# Patient Record
Sex: Male | Born: 1955 | Race: Black or African American | Hispanic: No | Marital: Single | Smoking: Current every day smoker
Health system: Southern US, Community
[De-identification: ages and names within clinical notes are randomized; demographics above are authoritative.]

## PROBLEM LIST (undated history)

## (undated) DIAGNOSIS — F329 Major depressive disorder, single episode, unspecified: Secondary | ICD-10-CM

## (undated) DIAGNOSIS — IMO0001 Reserved for inherently not codable concepts without codable children: Secondary | ICD-10-CM

## (undated) DIAGNOSIS — F32A Depression, unspecified: Secondary | ICD-10-CM

## (undated) DIAGNOSIS — M1990 Unspecified osteoarthritis, unspecified site: Secondary | ICD-10-CM

## (undated) DIAGNOSIS — K5790 Diverticulosis of intestine, part unspecified, without perforation or abscess without bleeding: Secondary | ICD-10-CM

## (undated) DIAGNOSIS — E785 Hyperlipidemia, unspecified: Secondary | ICD-10-CM

## (undated) DIAGNOSIS — C801 Malignant (primary) neoplasm, unspecified: Secondary | ICD-10-CM

## (undated) DIAGNOSIS — K219 Gastro-esophageal reflux disease without esophagitis: Secondary | ICD-10-CM

## (undated) DIAGNOSIS — I10 Essential (primary) hypertension: Secondary | ICD-10-CM

## (undated) DIAGNOSIS — C61 Malignant neoplasm of prostate: Secondary | ICD-10-CM

## (undated) DIAGNOSIS — F419 Anxiety disorder, unspecified: Secondary | ICD-10-CM

## (undated) HISTORY — PX: HIP ARTHROPLASTY: SHX981

## (undated) HISTORY — DX: Diverticulosis of intestine, part unspecified, without perforation or abscess without bleeding: K57.90

## (undated) HISTORY — DX: Hyperlipidemia, unspecified: E78.5

## (undated) HISTORY — DX: Anxiety disorder, unspecified: F41.9

## (undated) HISTORY — PX: COLONOSCOPY: SHX174

## (undated) HISTORY — DX: Malignant neoplasm of prostate: C61

## (undated) HISTORY — PX: TRANSURETHRAL RESECTION OF PROSTATE: SHX73

## (undated) HISTORY — DX: Malignant (primary) neoplasm, unspecified: C80.1

## (undated) HISTORY — DX: Unspecified osteoarthritis, unspecified site: M19.90

## (undated) HISTORY — DX: Gastro-esophageal reflux disease without esophagitis: K21.9

---

## 1999-02-21 ENCOUNTER — Emergency Department (HOSPITAL_COMMUNITY): Admission: EM | Admit: 1999-02-21 | Discharge: 1999-02-21 | Payer: Self-pay | Admitting: Emergency Medicine

## 2005-04-16 DIAGNOSIS — C61 Malignant neoplasm of prostate: Secondary | ICD-10-CM

## 2005-04-16 DIAGNOSIS — C801 Malignant (primary) neoplasm, unspecified: Secondary | ICD-10-CM

## 2005-04-16 HISTORY — DX: Malignant neoplasm of prostate: C61

## 2005-04-16 HISTORY — PX: PROSTATECTOMY: SHX69

## 2005-04-16 HISTORY — DX: Malignant (primary) neoplasm, unspecified: C80.1

## 2011-05-12 DIAGNOSIS — F322 Major depressive disorder, single episode, severe without psychotic features: Secondary | ICD-10-CM | POA: Diagnosis not present

## 2011-05-23 ENCOUNTER — Emergency Department (INDEPENDENT_AMBULATORY_CARE_PROVIDER_SITE_OTHER)
Admission: EM | Admit: 2011-05-23 | Discharge: 2011-05-23 | Disposition: A | Discharge status: Home / Self Care | Payer: Medicare Other | Source: Home / Self Care | Attending: Family Medicine | Admitting: Family Medicine

## 2011-05-23 ENCOUNTER — Encounter (HOSPITAL_COMMUNITY): Payer: Self-pay | Admitting: *Deleted

## 2011-05-23 DIAGNOSIS — C61 Malignant neoplasm of prostate: Secondary | ICD-10-CM | POA: Diagnosis not present

## 2011-05-23 NOTE — ED Provider Notes (Addendum)
History     CSN: [id number]  Arrival date & time 05/23/11  1411   First MD Initiated Contact with Patient 05/23/11 1518      Chief Complaint  Patient presents with  . Labs Only    (Consider location/radiation/quality/duration/timing/severity/associated sxs/prior treatment) HPI Comments: Ruben Andrews presents today for evaluation for a PSA check. He reports a history of prostate cancer 5 years ago. He reports a prostatectomy. He states they did not "remove it all." He reports that he then had radiation treatment. He had regular followups in [place] at this time. However, he recently became estranged from his fiance/girlfriend. Then moved back to [place] suddenly to this mom. Reports he has not had his PSA checked in a while and was supposed to. He last had it checked in August and reportedly it was a level of 9. He is rather emotionally distraught today. And emotionally labile. He is fluctuating between anger and being tearful. He is apologizing profusely for these emotions. He does not have a primary care provider here in the area. Nor has he had regular followup care since August. He reports that he is was a former [employer], but is currently unemployed. He states that once he knows the level he will be able to go back to his provider in [place] for regular followup or you will find a provider here. We discussed the meaningful use of a PSA simply. We also discussed that he will need much more followup examinations. We will check his PSA today. He denies any symptoms today.  Patient is a 56 y.o. male presenting with male genitourinary complaint. The history is provided by the patient.  Male GU Problem Primary symptoms include no dysuria, no penile discharge and no penile pain. This is a chronic problem. The problem has not changed since onset.Pertinent negatives include no anorexia, no nausea and no vomiting. There has been no fever.    Past Medical History  Diagnosis Date  . Prostate disease      Past Surgical History  Procedure Date  . Hip surgery     History reviewed. No pertinent family history.  History  Substance Use Topics  . Smoking status: Current Everyday Smoker  . Smokeless tobacco: Not on file  . Alcohol Use: Yes     VERY LITTLE      Review of Systems  Constitutional: Negative.   HENT: Negative.   Eyes: Negative.   Respiratory: Negative.   Cardiovascular: Negative.   Gastrointestinal: Negative.  Negative for nausea, vomiting and anorexia.  Genitourinary: Negative.  Negative for dysuria, penile pain and penile discharge.  Musculoskeletal: Negative.   Skin: Negative.   Neurological: Negative.     Allergies  Review of patient's allergies indicates not on file.  Home Medications  No current outpatient prescriptions on file.  BP 156/92  Pulse 77  Temp(Src) 98.2 F (36.8 C) (Oral)  Resp 18  SpO2 96%  Physical Exam  Nursing note and vitals reviewed. Constitutional: He is oriented to person, place, and time. He appears well-developed and well-nourished.  HENT:  Head: Normocephalic and atraumatic.  Eyes: EOM are normal.  Neck: Normal range of motion.  Pulmonary/Chest: Effort normal.  Musculoskeletal: Normal range of motion.  Neurological: He is alert and oriented to person, place, and time.  Skin: Skin is warm and dry.  Psychiatric: His behavior is normal.    ED Course  Procedures (including critical care time)   Labs Reviewed  PSA   No results found.   1. Prostate  cancer       MDM  Checked PSA; will inform him of result once back and reviewed; he wishes results to be telephoned to his mother's residence, Ms. Ruben Andrews: [phone]        Richardo Priest, MD 05/23/11 1724  Richardo Priest, MD 05/23/11 1726

## 2011-05-23 NOTE — ED Notes (Signed)
Pt  Wants  His   PSA   CHECKED    PT ALLEDGES  TO  HAVE  HISTORY  OF  PROSTATE  PROBLEMS   IN PAST        HE  IS  FROM  OUT OF  STATE     AND  WNTS  BLOOD  WORK  DONE  HE   VERBALIZES  NO  SYMPTOMS         HE  WALKS  WUPRIGHT  WITH A  SMOOTH  STEADY  GAIT

## 2011-06-06 ENCOUNTER — Telehealth (HOSPITAL_COMMUNITY): Payer: Self-pay | Admitting: *Deleted

## 2011-06-06 NOTE — ED Notes (Signed)
2/18 Pt. called on VM and requested result of PSA.  2/20 I called pt. Back and verified DOB and told pt. PSA was 1.78 and was WNL.  Pt. asked about getting a refill of his pain medicines,if he gets the information from his doctor. He is visiting from [place] for 2 more weeks. I told him we did not do chronic pain management. He should call his PCP or go to an ED.

## 2011-06-06 NOTE — ED Notes (Signed)
Pt. called back and said if he got his doctors office in [place] to fax the record of his medications, could he get refill of Lortab.  Hx. bil. hip replacements. I told him he could try but I could not guarantee that our doctor would do a refill. He needs his doctors office number also, so our doctor can call and verify information.Ruben Andrews 06/06/2011

## 2011-07-05 ENCOUNTER — Emergency Department (INDEPENDENT_AMBULATORY_CARE_PROVIDER_SITE_OTHER)
Admission: EM | Admit: 2011-07-05 | Discharge: 2011-07-05 | Disposition: A | Discharge status: Home / Self Care | Payer: Medicare Other | Source: Home / Self Care | Attending: Family Medicine | Admitting: Family Medicine

## 2011-07-05 ENCOUNTER — Encounter (HOSPITAL_COMMUNITY): Payer: Self-pay

## 2011-07-05 DIAGNOSIS — J069 Acute upper respiratory infection, unspecified: Secondary | ICD-10-CM

## 2011-07-05 DIAGNOSIS — G8929 Other chronic pain: Secondary | ICD-10-CM

## 2011-07-05 HISTORY — DX: Essential (primary) hypertension: I10

## 2011-07-05 HISTORY — DX: Depression, unspecified: F32.A

## 2011-07-05 HISTORY — DX: Major depressive disorder, single episode, unspecified: F32.9

## 2011-07-05 MED ORDER — AMOXICILLIN 500 MG PO CAPS: 500.0000 mg | ORAL_CAPSULE | Freq: Three times a day (TID) | ORAL | Status: AC

## 2011-07-05 MED ORDER — GUAIFENESIN-CODEINE 100-10 MG/5ML PO SYRP: 5.0000 mL | ORAL_SOLUTION | Freq: Four times a day (QID) | ORAL | Status: AC | PRN

## 2011-07-05 NOTE — ED Notes (Signed)
C/o head congestion, productive cough, chest hurts with cough and SOB.  SX for 2 days.  Denies fever.

## 2011-07-05 NOTE — Discharge Instructions (Signed)
Tylenol or motrin as needed. Avoid caffeine and milk products. Call number for pain management. Have number for your doctor in [place] ready as well.

## 2011-07-05 NOTE — ED Provider Notes (Signed)
History     CSN: [id number]  Arrival date & time 07/05/11  0805   First MD Initiated Contact with Patient 07/05/11 [phone]      Chief Complaint  Patient presents with  . URI  . Cough    (Consider location/radiation/quality/duration/timing/severity/associated sxs/prior treatment) HPI Comments: The patient reports having cough and cold symptoms x 2 days. Chest hurts with coughing. Cough is non productive. Denies fever. In addition he is asking for pain medication. States he has been in pain management in [place] but moved here over 2 months ago. States he has not had medications in over 2 months. He is informed we would not be able to help with pain management but could give him the name of the pain management clinic.   The history is provided by the patient.    Past Medical History  Diagnosis Date  . Prostate disease   . Hypertension   . Depression     Past Surgical History  Procedure Date  . Hip surgery     History reviewed. No pertinent family history.  History  Substance Use Topics  . Smoking status: Current Everyday Smoker -- 1.0 packs/day  . Smokeless tobacco: Not on file  . Alcohol Use: Yes     VERY LITTLE      Review of Systems  Constitutional: Negative.   HENT: Positive for congestion, rhinorrhea and postnasal drip. Negative for ear pain, sore throat and facial swelling.   Respiratory: Positive for cough.   Cardiovascular: Negative.   Gastrointestinal: Negative.   Genitourinary: Negative.   Skin: Negative.     Allergies  Review of patient's allergies indicates no known allergies.  Home Medications   Current Outpatient Rx  Name Route Sig Dispense Refill  . AMOXICILLIN 500 MG PO CAPS Oral Take 1 capsule (500 mg total) by mouth 3 (three) times daily. 30 capsule 0  . GUAIFENESIN-CODEINE 100-10 MG/5ML PO SYRP Oral Take 5 mLs by mouth every 6 (six) hours as needed for cough. 120 mL 0    BP 134/93  Pulse 72  Temp(Src) 97.7 F (36.5 C) (Oral)  Resp  16  SpO2 97%  Physical Exam  Nursing note and vitals reviewed. Constitutional: He appears well-developed and well-nourished.  HENT:  Head: Atraumatic.       Ears clear, nose clear, throat mild erythema  Neck: Normal range of motion. Neck supple.  Cardiovascular: Normal rate, regular rhythm and normal heart sounds.   Pulmonary/Chest: Effort normal and breath sounds normal.       Congested cough  Lymphadenopathy:    He has no cervical adenopathy.  Skin: Skin is warm and dry.    ED Course  Procedures (including critical care time)  Labs Reviewed - No data to display No results found.   1. Upper respiratory infection   2. Chronic pain       MDM          Randa Spike, MD 07/05/11 [phone]

## 2011-07-23 DIAGNOSIS — M545 Low back pain: Secondary | ICD-10-CM | POA: Diagnosis not present

## 2011-07-23 DIAGNOSIS — I10 Essential (primary) hypertension: Secondary | ICD-10-CM | POA: Diagnosis not present

## 2011-07-23 DIAGNOSIS — G8929 Other chronic pain: Secondary | ICD-10-CM | POA: Diagnosis not present

## 2011-07-23 DIAGNOSIS — Z9119 Patient's noncompliance with other medical treatment and regimen: Secondary | ICD-10-CM | POA: Diagnosis not present

## 2011-07-23 DIAGNOSIS — F329 Major depressive disorder, single episode, unspecified: Secondary | ICD-10-CM | POA: Diagnosis not present

## 2011-10-05 DIAGNOSIS — F17200 Nicotine dependence, unspecified, uncomplicated: Secondary | ICD-10-CM | POA: Diagnosis not present

## 2011-10-05 DIAGNOSIS — I10 Essential (primary) hypertension: Secondary | ICD-10-CM | POA: Diagnosis not present

## 2011-10-05 DIAGNOSIS — M76899 Other specified enthesopathies of unspecified lower limb, excluding foot: Secondary | ICD-10-CM | POA: Diagnosis not present

## 2011-10-05 DIAGNOSIS — Z96649 Presence of unspecified artificial hip joint: Secondary | ICD-10-CM | POA: Diagnosis not present

## 2011-10-05 DIAGNOSIS — M25559 Pain in unspecified hip: Secondary | ICD-10-CM | POA: Diagnosis not present

## 2011-10-05 DIAGNOSIS — R262 Difficulty in walking, not elsewhere classified: Secondary | ICD-10-CM | POA: Diagnosis not present

## 2011-11-30 DIAGNOSIS — C61 Malignant neoplasm of prostate: Secondary | ICD-10-CM | POA: Diagnosis not present

## 2011-12-06 DIAGNOSIS — F39 Unspecified mood [affective] disorder: Secondary | ICD-10-CM | POA: Diagnosis not present

## 2012-01-08 DIAGNOSIS — Z0000 Encounter for general adult medical examination without abnormal findings: Secondary | ICD-10-CM | POA: Diagnosis not present

## 2012-01-10 DIAGNOSIS — F17200 Nicotine dependence, unspecified, uncomplicated: Secondary | ICD-10-CM | POA: Diagnosis not present

## 2012-01-10 DIAGNOSIS — F329 Major depressive disorder, single episode, unspecified: Secondary | ICD-10-CM | POA: Diagnosis not present

## 2012-01-10 DIAGNOSIS — Z8546 Personal history of malignant neoplasm of prostate: Secondary | ICD-10-CM | POA: Diagnosis not present

## 2012-01-10 DIAGNOSIS — M25559 Pain in unspecified hip: Secondary | ICD-10-CM | POA: Diagnosis not present

## 2012-01-10 DIAGNOSIS — I10 Essential (primary) hypertension: Secondary | ICD-10-CM | POA: Diagnosis not present

## 2012-01-27 DIAGNOSIS — Z8546 Personal history of malignant neoplasm of prostate: Secondary | ICD-10-CM | POA: Diagnosis not present

## 2012-01-27 DIAGNOSIS — M25559 Pain in unspecified hip: Secondary | ICD-10-CM | POA: Diagnosis not present

## 2012-01-27 DIAGNOSIS — Z96649 Presence of unspecified artificial hip joint: Secondary | ICD-10-CM | POA: Diagnosis not present

## 2012-01-29 DIAGNOSIS — I10 Essential (primary) hypertension: Secondary | ICD-10-CM | POA: Diagnosis not present

## 2012-01-29 DIAGNOSIS — M25559 Pain in unspecified hip: Secondary | ICD-10-CM | POA: Diagnosis not present

## 2012-01-29 DIAGNOSIS — Z1339 Encounter for screening examination for other mental health and behavioral disorders: Secondary | ICD-10-CM | POA: Diagnosis not present

## 2012-01-29 DIAGNOSIS — F17200 Nicotine dependence, unspecified, uncomplicated: Secondary | ICD-10-CM | POA: Diagnosis not present

## 2012-01-29 DIAGNOSIS — Z1331 Encounter for screening for depression: Secondary | ICD-10-CM | POA: Diagnosis not present

## 2012-01-29 DIAGNOSIS — R269 Unspecified abnormalities of gait and mobility: Secondary | ICD-10-CM | POA: Diagnosis not present

## 2012-02-05 DIAGNOSIS — I10 Essential (primary) hypertension: Secondary | ICD-10-CM | POA: Diagnosis not present

## 2012-04-11 DIAGNOSIS — F39 Unspecified mood [affective] disorder: Secondary | ICD-10-CM | POA: Diagnosis not present

## 2012-06-20 DIAGNOSIS — T8489XA Other specified complication of internal orthopedic prosthetic devices, implants and grafts, initial encounter: Secondary | ICD-10-CM | POA: Diagnosis not present

## 2012-06-20 DIAGNOSIS — Z96649 Presence of unspecified artificial hip joint: Secondary | ICD-10-CM | POA: Diagnosis not present

## 2012-06-20 DIAGNOSIS — M25559 Pain in unspecified hip: Secondary | ICD-10-CM | POA: Diagnosis not present

## 2012-07-04 DIAGNOSIS — T8489XA Other specified complication of internal orthopedic prosthetic devices, implants and grafts, initial encounter: Secondary | ICD-10-CM | POA: Diagnosis not present

## 2012-07-04 DIAGNOSIS — M25559 Pain in unspecified hip: Secondary | ICD-10-CM | POA: Diagnosis not present

## 2012-07-22 DIAGNOSIS — M25559 Pain in unspecified hip: Secondary | ICD-10-CM | POA: Diagnosis not present

## 2012-07-22 DIAGNOSIS — I10 Essential (primary) hypertension: Secondary | ICD-10-CM | POA: Diagnosis not present

## 2012-07-22 DIAGNOSIS — C61 Malignant neoplasm of prostate: Secondary | ICD-10-CM | POA: Diagnosis not present

## 2013-03-16 DIAGNOSIS — Z1331 Encounter for screening for depression: Secondary | ICD-10-CM | POA: Diagnosis not present

## 2013-03-16 DIAGNOSIS — M25559 Pain in unspecified hip: Secondary | ICD-10-CM | POA: Diagnosis not present

## 2013-03-16 DIAGNOSIS — Z6837 Body mass index (BMI) 37.0-37.9, adult: Secondary | ICD-10-CM | POA: Diagnosis not present

## 2013-03-16 DIAGNOSIS — Z800 Family history of malignant neoplasm of digestive organs: Secondary | ICD-10-CM | POA: Diagnosis not present

## 2013-03-16 DIAGNOSIS — I10 Essential (primary) hypertension: Secondary | ICD-10-CM | POA: Diagnosis not present

## 2013-03-16 DIAGNOSIS — R972 Elevated prostate specific antigen [PSA]: Secondary | ICD-10-CM | POA: Diagnosis not present

## 2013-03-16 DIAGNOSIS — Z8546 Personal history of malignant neoplasm of prostate: Secondary | ICD-10-CM | POA: Diagnosis not present

## 2013-03-18 ENCOUNTER — Ambulatory Visit (AMBULATORY_SURGERY_CENTER): Payer: Medicare Other | Admitting: *Deleted

## 2013-03-18 ENCOUNTER — Other Ambulatory Visit (HOSPITAL_COMMUNITY): Payer: Self-pay | Admitting: Internal Medicine

## 2013-03-18 VITALS — Ht 73.0 in | Wt 280.2 lb

## 2013-03-18 DIAGNOSIS — R972 Elevated prostate specific antigen [PSA]: Secondary | ICD-10-CM

## 2013-03-18 DIAGNOSIS — Z86010 Personal history of colonic polyps: Secondary | ICD-10-CM

## 2013-03-18 MED ORDER — NA SULFATE-K SULFATE-MG SULF 17.5-3.13-1.6 GM/177ML PO SOLN: ORAL | Status: DC

## 2013-03-18 NOTE — Progress Notes (Signed)
Patient denies any allergies to eggs or soy. Patient denies any problems with anesthesia.  

## 2013-03-19 DIAGNOSIS — N433 Hydrocele, unspecified: Secondary | ICD-10-CM | POA: Diagnosis not present

## 2013-03-19 DIAGNOSIS — N393 Stress incontinence (female) (male): Secondary | ICD-10-CM | POA: Diagnosis not present

## 2013-03-19 DIAGNOSIS — C61 Malignant neoplasm of prostate: Secondary | ICD-10-CM | POA: Diagnosis not present

## 2013-03-19 DIAGNOSIS — N529 Male erectile dysfunction, unspecified: Secondary | ICD-10-CM | POA: Diagnosis not present

## 2013-03-27 ENCOUNTER — Encounter (HOSPITAL_COMMUNITY)
Admission: RE | Admit: 2013-03-27 | Discharge: 2013-03-27 | Disposition: A | Discharge status: Home / Self Care | Payer: Medicare Other | Source: Ambulatory Visit | Attending: Internal Medicine | Admitting: Internal Medicine

## 2013-03-27 DIAGNOSIS — R972 Elevated prostate specific antigen [PSA]: Secondary | ICD-10-CM | POA: Insufficient documentation

## 2013-03-27 DIAGNOSIS — Z96649 Presence of unspecified artificial hip joint: Secondary | ICD-10-CM | POA: Diagnosis not present

## 2013-03-27 DIAGNOSIS — Z8546 Personal history of malignant neoplasm of prostate: Secondary | ICD-10-CM | POA: Diagnosis not present

## 2013-03-27 DIAGNOSIS — M25559 Pain in unspecified hip: Secondary | ICD-10-CM | POA: Insufficient documentation

## 2013-03-27 MED ORDER — TECHNETIUM TC 99M MEDRONATE IV KIT
25.4000 | PACK | Freq: Once | INTRAVENOUS | Status: AC | PRN
  Administered 2013-03-27: 25.4 via INTRAVENOUS

## 2013-03-30 ENCOUNTER — Encounter: Payer: Self-pay | Admitting: Internal Medicine

## 2013-03-30 ENCOUNTER — Ambulatory Visit (AMBULATORY_SURGERY_CENTER): Payer: Medicare Other | Admitting: Internal Medicine

## 2013-03-30 VITALS — BP 121/76 | HR 65 | Temp 97.4°F | Resp 25 | Ht 73.0 in | Wt 280.0 lb

## 2013-03-30 DIAGNOSIS — Z860100 Personal history of colon polyps, unspecified: Secondary | ICD-10-CM | POA: Insufficient documentation

## 2013-03-30 DIAGNOSIS — D126 Benign neoplasm of colon, unspecified: Secondary | ICD-10-CM

## 2013-03-30 DIAGNOSIS — Z800 Family history of malignant neoplasm of digestive organs: Secondary | ICD-10-CM | POA: Diagnosis not present

## 2013-03-30 DIAGNOSIS — K5730 Diverticulosis of large intestine without perforation or abscess without bleeding: Secondary | ICD-10-CM

## 2013-03-30 DIAGNOSIS — Z1211 Encounter for screening for malignant neoplasm of colon: Secondary | ICD-10-CM | POA: Diagnosis not present

## 2013-03-30 DIAGNOSIS — I10 Essential (primary) hypertension: Secondary | ICD-10-CM | POA: Diagnosis not present

## 2013-03-30 MED ORDER — SODIUM CHLORIDE 0.9 % IV SOLN: 500.0000 mL | INTRAVENOUS | Status: DC

## 2013-03-30 NOTE — Progress Notes (Signed)
Patient did not have preoperative order for IV antibiotic SSI prophylaxis. (G8918)  Patient did not experience any of the following events: a burn prior to discharge; a fall within the facility; wrong site/side/patient/procedure/implant event; or a hospital transfer or hospital admission upon discharge from the facility. (G8907)  

## 2013-03-30 NOTE — Progress Notes (Signed)
Called to room to assist during endoscopic procedure.  Patient ID and intended procedure confirmed with present staff. Received instructions for my participation in the procedure from the performing physician.  

## 2013-03-30 NOTE — Op Note (Signed)
[facility] [address] [place], [address]   COLONOSCOPY PROCEDURE REPORT  PATIENT: Ruben Andrews, Ruben Andrews  MR#: [id number] BIRTHDATE: 1955-07-07 , 57  yrs. old GENDER: Male ENDOSCOPIST: Iva Boop, MD, [facility] REFERRED AO:ZHYQM Link Snuffer, M.D. PROCEDURE DATE:  03/30/2013 PROCEDURE:   Colonoscopy with biopsy and snare polypectomy First Screening Colonoscopy - Avg.  risk and is 50 yrs.  old or older - No.  Prior Negative Screening - Now for repeat screening. N/A  History of Adenoma - Now for follow-up colonoscopy & has been > or = to 3 yrs.  Yes hx of adenoma.  Has been 3 or more years since last colonoscopy.  Polyps Removed Today? Yes. ASA CLASS:   Class II INDICATIONS:Patient's personal history of adenomatous colon polyps and Patient's immediate family history of colon cancer. MEDICATIONS: propofol (Diprivan) 300mg  IV, MAC sedation, administered by CRNA, and These medications were titrated to patient response per physician's verbal order  DESCRIPTION OF PROCEDURE:   After the risks benefits and alternatives of the procedure were thoroughly explained, informed consent was obtained.  A digital rectal exam revealed no abnormalities of the rectum, A digital rectal exam revealed no prostatic nodules, and A digital rectal exam revealed the prostate was not enlarged.   The LB [id number]  endoscope was introduced through the anus and advanced to the cecum, which was identified by both the appendix and ileocecal valve. No adverse events experienced.   The quality of the prep was excellent using Suprep  The instrument was then slowly withdrawn as the colon was fully examined.  COLON FINDINGS: Four diminutive sessile polyps were found at the cecum and in the sigmoid colon.  A polypectomy was performed with cold forceps (cecum)and with a cold snare (sigmoid).  The resection was complete and the polyp tissue was completely retrieved.   Mild diverticulosis was noted  throughout the entire examined colon. The colon mucosa was otherwise normal.  Retroflexed views revealed no abnormalities. The time to cecum=4 minutes 16 seconds. Withdrawal time=8 minutes 37 seconds.  The scope was withdrawn and the procedure completed. COMPLICATIONS: There were no complications.  ENDOSCOPIC IMPRESSION: 1.   Four diminutive sessile polyps were found at the cecum and in the sigmoid colon; polypectomy was performed with cold forceps and with a cold snare 2.   Mild diverticulosis was noted throughout the entire examined colon 3.   The colon mucosa was otherwise normal  RECOMMENDATIONS: 1.  Timing of repeat colonoscopy will be determined by pathology findings. 2.   Probably in 5 years (2019)  eSigned:  Iva Boop, MD, [facility] 03/30/2013 1:56 PM cc: The Patient  and Alysia Penna, MD

## 2013-03-30 NOTE — Patient Instructions (Addendum)
I found and removed 4 small polyps. You also have a condition called diverticulosis - common and not usually a problem. Please read the handout provided.  I will let you know pathology results and when to have another routine colonoscopy by mail.  I appreciate the opportunity to care for you. Iva Boop, MD, FACG   YOU HAD AN ENDOSCOPIC PROCEDURE TODAY AT THE [facility]: Refer to the procedure report that was given to you for any specific questions about what was found during the examination.  If the procedure report does not answer your questions, please call your gastroenterologist to clarify.  If you requested that your care partner not be given the details of your procedure findings, then the procedure report has been included in a sealed envelope for you to review at your convenience later.  YOU SHOULD EXPECT: Some feelings of bloating in the abdomen. Passage of more gas than usual.  Walking can help get rid of the air that was put into your GI tract during the procedure and reduce the bloating. If you had a lower endoscopy (such as a colonoscopy or flexible sigmoidoscopy) you may notice spotting of blood in your stool or on the toilet paper. If you underwent a bowel prep for your procedure, then you may not have a normal bowel movement for a few days.  DIET: Your first meal following the procedure should be a light meal and then it is ok to progress to your normal diet.  A half-sandwich or bowl of soup is an example of a good first meal.  Heavy or fried foods are harder to digest and may make you feel nauseous or bloated.  Likewise meals heavy in dairy and vegetables can cause extra gas to form and this can also increase the bloating.  Drink plenty of fluids but you should avoid alcoholic beverages for 24 hours.  ACTIVITY: Your care partner should take you home directly after the procedure.  You should plan to take it easy, moving slowly for the rest of the day.  You can  resume normal activity the day after the procedure however you should NOT DRIVE or use heavy machinery for 24 hours (because of the sedation medicines used during the test).    SYMPTOMS TO REPORT IMMEDIATELY: A gastroenterologist can be reached at any hour.  During normal business hours, 8:30 AM to 5:00 PM Monday through Friday, call [phone].  After hours and on weekends, please call the GI answering service at [phone] who will take a message and have the physician on call contact you.   Following lower endoscopy (colonoscopy or flexible sigmoidoscopy):  Excessive amounts of blood in the stool  Significant tenderness or worsening of abdominal pains  Swelling of the abdomen that is new, acute  Fever of 100F or higher    FOLLOW UP: If any biopsies were taken you will be contacted by phone or by letter within the next 1-3 weeks.  Call your gastroenterologist if you have not heard about the biopsies in 3 weeks.  Our staff will call the home number listed on your records the next business day following your procedure to check on you and address any questions or concerns that you may have at that time regarding the information given to you following your procedure. This is a courtesy call and so if there is no answer at the home number and we have not heard from you through the emergency physician on call, we will assume  that you have returned to your regular daily activities without incident.  SIGNATURES/CONFIDENTIALITY: You and/or your care partner have signed paperwork which will be entered into your electronic medical record.  These signatures attest to the fact that that the information above on your After Visit Summary has been reviewed and is understood.  Full responsibility of the confidentiality of this discharge information lies with you and/or your care-partner.    Information on polyps & diverticulosis given to you today

## 2013-03-31 ENCOUNTER — Telehealth: Payer: Self-pay | Admitting: *Deleted

## 2013-03-31 NOTE — Telephone Encounter (Signed)
No identifier, left message, follow-up  

## 2013-04-01 ENCOUNTER — Telehealth: Payer: Self-pay | Admitting: Internal Medicine

## 2013-04-01 DIAGNOSIS — C61 Malignant neoplasm of prostate: Secondary | ICD-10-CM | POA: Diagnosis not present

## 2013-04-01 NOTE — Telephone Encounter (Signed)
Patient reports that since the colon on 03/30/13 he has had abdominal pressure.  He reports that he has excess gas.  He denies fever, constipation, diarrhea or rectal bleeding.  He is advised to try carbonated beverages or room temperature liquids to help relieve the gas.  He will call back for any additional questions or concerns

## 2013-04-02 DIAGNOSIS — C61 Malignant neoplasm of prostate: Secondary | ICD-10-CM | POA: Diagnosis not present

## 2013-04-03 ENCOUNTER — Encounter: Payer: Self-pay | Admitting: Internal Medicine

## 2013-04-03 NOTE — Progress Notes (Signed)
Quick Note:  3 diminutive adenomas - repeat colon 2019 ______

## 2013-06-16 DIAGNOSIS — Z125 Encounter for screening for malignant neoplasm of prostate: Secondary | ICD-10-CM | POA: Diagnosis not present

## 2013-06-16 DIAGNOSIS — I10 Essential (primary) hypertension: Secondary | ICD-10-CM | POA: Diagnosis not present

## 2013-06-22 DIAGNOSIS — E1165 Type 2 diabetes mellitus with hyperglycemia: Secondary | ICD-10-CM | POA: Diagnosis not present

## 2013-06-22 DIAGNOSIS — IMO0002 Reserved for concepts with insufficient information to code with codable children: Secondary | ICD-10-CM | POA: Diagnosis not present

## 2013-06-23 DIAGNOSIS — Z0000 Encounter for general adult medical examination without abnormal findings: Secondary | ICD-10-CM | POA: Diagnosis not present

## 2013-06-23 DIAGNOSIS — E1165 Type 2 diabetes mellitus with hyperglycemia: Secondary | ICD-10-CM | POA: Diagnosis not present

## 2013-06-23 DIAGNOSIS — IMO0002 Reserved for concepts with insufficient information to code with codable children: Secondary | ICD-10-CM | POA: Diagnosis not present

## 2013-06-23 DIAGNOSIS — R972 Elevated prostate specific antigen [PSA]: Secondary | ICD-10-CM | POA: Diagnosis not present

## 2013-06-23 DIAGNOSIS — E785 Hyperlipidemia, unspecified: Secondary | ICD-10-CM | POA: Diagnosis not present

## 2013-06-23 DIAGNOSIS — I10 Essential (primary) hypertension: Secondary | ICD-10-CM | POA: Diagnosis not present

## 2013-06-23 DIAGNOSIS — Z86010 Personal history of colonic polyps: Secondary | ICD-10-CM | POA: Diagnosis not present

## 2013-06-23 DIAGNOSIS — M1610 Unilateral primary osteoarthritis, unspecified hip: Secondary | ICD-10-CM | POA: Diagnosis not present

## 2013-06-23 DIAGNOSIS — F17200 Nicotine dependence, unspecified, uncomplicated: Secondary | ICD-10-CM | POA: Diagnosis not present

## 2013-06-24 DIAGNOSIS — N393 Stress incontinence (female) (male): Secondary | ICD-10-CM | POA: Diagnosis not present

## 2013-06-24 DIAGNOSIS — C61 Malignant neoplasm of prostate: Secondary | ICD-10-CM | POA: Diagnosis not present

## 2013-06-24 DIAGNOSIS — N529 Male erectile dysfunction, unspecified: Secondary | ICD-10-CM | POA: Diagnosis not present

## 2013-07-03 DIAGNOSIS — M25559 Pain in unspecified hip: Secondary | ICD-10-CM | POA: Diagnosis not present

## 2013-07-15 ENCOUNTER — Other Ambulatory Visit (HOSPITAL_COMMUNITY): Payer: Self-pay | Admitting: Orthopedic Surgery

## 2013-07-15 DIAGNOSIS — M25559 Pain in unspecified hip: Secondary | ICD-10-CM | POA: Diagnosis not present

## 2013-07-15 DIAGNOSIS — M25552 Pain in left hip: Secondary | ICD-10-CM

## 2013-07-23 ENCOUNTER — Ambulatory Visit (HOSPITAL_COMMUNITY): Payer: Medicare Other

## 2013-07-23 ENCOUNTER — Encounter (HOSPITAL_COMMUNITY): Payer: Medicare Other

## 2013-07-28 DIAGNOSIS — T84498A Other mechanical complication of other internal orthopedic devices, implants and grafts, initial encounter: Secondary | ICD-10-CM | POA: Diagnosis not present

## 2013-07-28 DIAGNOSIS — M25559 Pain in unspecified hip: Secondary | ICD-10-CM | POA: Diagnosis not present

## 2013-08-04 DIAGNOSIS — Z86010 Personal history of colonic polyps: Secondary | ICD-10-CM | POA: Diagnosis not present

## 2013-08-04 DIAGNOSIS — I10 Essential (primary) hypertension: Secondary | ICD-10-CM | POA: Diagnosis not present

## 2013-08-04 DIAGNOSIS — IMO0001 Reserved for inherently not codable concepts without codable children: Secondary | ICD-10-CM | POA: Diagnosis not present

## 2013-08-04 DIAGNOSIS — R972 Elevated prostate specific antigen [PSA]: Secondary | ICD-10-CM | POA: Diagnosis not present

## 2013-08-04 DIAGNOSIS — F528 Other sexual dysfunction not due to a substance or known physiological condition: Secondary | ICD-10-CM | POA: Diagnosis not present

## 2013-08-04 DIAGNOSIS — M25559 Pain in unspecified hip: Secondary | ICD-10-CM | POA: Diagnosis not present

## 2013-08-04 DIAGNOSIS — E785 Hyperlipidemia, unspecified: Secondary | ICD-10-CM | POA: Diagnosis not present

## 2013-08-04 DIAGNOSIS — Z8546 Personal history of malignant neoplasm of prostate: Secondary | ICD-10-CM | POA: Diagnosis not present

## 2013-08-07 DIAGNOSIS — Z1212 Encounter for screening for malignant neoplasm of rectum: Secondary | ICD-10-CM | POA: Diagnosis not present

## 2013-08-17 DIAGNOSIS — C61 Malignant neoplasm of prostate: Secondary | ICD-10-CM | POA: Diagnosis not present

## 2013-08-19 ENCOUNTER — Other Ambulatory Visit: Payer: Self-pay | Admitting: Urology

## 2013-08-19 DIAGNOSIS — C61 Malignant neoplasm of prostate: Secondary | ICD-10-CM

## 2013-08-27 DIAGNOSIS — R7402 Elevation of levels of lactic acid dehydrogenase (LDH): Secondary | ICD-10-CM | POA: Diagnosis not present

## 2013-08-27 DIAGNOSIS — R7401 Elevation of levels of liver transaminase levels: Secondary | ICD-10-CM | POA: Diagnosis not present

## 2013-08-27 DIAGNOSIS — R1084 Generalized abdominal pain: Secondary | ICD-10-CM | POA: Diagnosis not present

## 2013-08-27 DIAGNOSIS — I10 Essential (primary) hypertension: Secondary | ICD-10-CM | POA: Diagnosis not present

## 2013-08-27 DIAGNOSIS — N189 Chronic kidney disease, unspecified: Secondary | ICD-10-CM | POA: Diagnosis not present

## 2013-08-31 ENCOUNTER — Encounter (HOSPITAL_COMMUNITY)
Admission: RE | Admit: 2013-08-31 | Discharge: 2013-08-31 | Disposition: A | Discharge status: Home / Self Care | Payer: Medicaid Other | Source: Ambulatory Visit | Attending: Urology | Admitting: Urology

## 2013-08-31 DIAGNOSIS — M25559 Pain in unspecified hip: Secondary | ICD-10-CM | POA: Insufficient documentation

## 2013-08-31 DIAGNOSIS — C61 Malignant neoplasm of prostate: Secondary | ICD-10-CM | POA: Diagnosis not present

## 2013-08-31 DIAGNOSIS — Z006 Encounter for examination for normal comparison and control in clinical research program: Secondary | ICD-10-CM | POA: Insufficient documentation

## 2013-08-31 DIAGNOSIS — Z96649 Presence of unspecified artificial hip joint: Secondary | ICD-10-CM | POA: Insufficient documentation

## 2013-08-31 MED ORDER — TECHNETIUM TC 99M MEDRONATE IV KIT
26.1000 | PACK | Freq: Once | INTRAVENOUS | Status: AC | PRN
  Administered 2013-08-31: 26.1 via INTRAVENOUS

## 2013-09-08 DIAGNOSIS — K219 Gastro-esophageal reflux disease without esophagitis: Secondary | ICD-10-CM | POA: Diagnosis not present

## 2013-09-08 DIAGNOSIS — R748 Abnormal levels of other serum enzymes: Secondary | ICD-10-CM | POA: Diagnosis not present

## 2013-09-08 DIAGNOSIS — R7401 Elevation of levels of liver transaminase levels: Secondary | ICD-10-CM | POA: Diagnosis not present

## 2013-09-08 DIAGNOSIS — R7402 Elevation of levels of lactic acid dehydrogenase (LDH): Secondary | ICD-10-CM | POA: Diagnosis not present

## 2013-09-08 DIAGNOSIS — R1084 Generalized abdominal pain: Secondary | ICD-10-CM | POA: Diagnosis not present

## 2013-09-09 ENCOUNTER — Encounter: Payer: Self-pay | Admitting: Cardiovascular Disease

## 2013-09-09 ENCOUNTER — Other Ambulatory Visit: Payer: Self-pay | Admitting: Internal Medicine

## 2013-09-09 DIAGNOSIS — R109 Unspecified abdominal pain: Secondary | ICD-10-CM

## 2013-09-11 ENCOUNTER — Other Ambulatory Visit: Payer: Medicare Other

## 2013-09-21 DIAGNOSIS — F528 Other sexual dysfunction not due to a substance or known physiological condition: Secondary | ICD-10-CM | POA: Diagnosis not present

## 2013-09-21 DIAGNOSIS — R972 Elevated prostate specific antigen [PSA]: Secondary | ICD-10-CM | POA: Diagnosis not present

## 2013-09-21 DIAGNOSIS — Z86010 Personal history of colonic polyps: Secondary | ICD-10-CM | POA: Diagnosis not present

## 2013-09-21 DIAGNOSIS — I10 Essential (primary) hypertension: Secondary | ICD-10-CM | POA: Diagnosis not present

## 2013-09-21 DIAGNOSIS — R748 Abnormal levels of other serum enzymes: Secondary | ICD-10-CM | POA: Diagnosis not present

## 2013-09-21 DIAGNOSIS — M1610 Unilateral primary osteoarthritis, unspecified hip: Secondary | ICD-10-CM | POA: Diagnosis not present

## 2013-09-21 DIAGNOSIS — IMO0001 Reserved for inherently not codable concepts without codable children: Secondary | ICD-10-CM | POA: Diagnosis not present

## 2013-09-21 DIAGNOSIS — E785 Hyperlipidemia, unspecified: Secondary | ICD-10-CM | POA: Diagnosis not present

## 2013-09-28 DIAGNOSIS — C61 Malignant neoplasm of prostate: Secondary | ICD-10-CM | POA: Diagnosis not present

## 2013-09-30 DIAGNOSIS — H2510 Age-related nuclear cataract, unspecified eye: Secondary | ICD-10-CM | POA: Diagnosis not present

## 2013-09-30 DIAGNOSIS — E119 Type 2 diabetes mellitus without complications: Secondary | ICD-10-CM | POA: Diagnosis not present

## 2013-09-30 DIAGNOSIS — H52209 Unspecified astigmatism, unspecified eye: Secondary | ICD-10-CM | POA: Diagnosis not present

## 2013-09-30 DIAGNOSIS — H5200 Hypermetropia, unspecified eye: Secondary | ICD-10-CM | POA: Diagnosis not present

## 2013-10-06 DIAGNOSIS — M25559 Pain in unspecified hip: Secondary | ICD-10-CM | POA: Diagnosis not present

## 2013-10-09 DIAGNOSIS — R748 Abnormal levels of other serum enzymes: Secondary | ICD-10-CM | POA: Diagnosis not present

## 2013-10-09 DIAGNOSIS — I10 Essential (primary) hypertension: Secondary | ICD-10-CM | POA: Diagnosis not present

## 2013-11-05 DIAGNOSIS — M25559 Pain in unspecified hip: Secondary | ICD-10-CM | POA: Diagnosis not present

## 2013-12-07 ENCOUNTER — Other Ambulatory Visit: Payer: Self-pay | Admitting: Urology

## 2013-12-07 DIAGNOSIS — C61 Malignant neoplasm of prostate: Secondary | ICD-10-CM

## 2014-02-01 DIAGNOSIS — B351 Tinea unguium: Secondary | ICD-10-CM | POA: Diagnosis not present

## 2014-02-01 DIAGNOSIS — R972 Elevated prostate specific antigen [PSA]: Secondary | ICD-10-CM | POA: Diagnosis not present

## 2014-02-01 DIAGNOSIS — Z86010 Personal history of colonic polyps: Secondary | ICD-10-CM | POA: Diagnosis not present

## 2014-02-01 DIAGNOSIS — E785 Hyperlipidemia, unspecified: Secondary | ICD-10-CM | POA: Diagnosis not present

## 2014-02-01 DIAGNOSIS — M6282 Rhabdomyolysis: Secondary | ICD-10-CM | POA: Diagnosis not present

## 2014-02-01 DIAGNOSIS — F5221 Male erectile disorder: Secondary | ICD-10-CM | POA: Diagnosis not present

## 2014-02-01 DIAGNOSIS — Z23 Encounter for immunization: Secondary | ICD-10-CM | POA: Diagnosis not present

## 2014-02-01 DIAGNOSIS — E119 Type 2 diabetes mellitus without complications: Secondary | ICD-10-CM | POA: Diagnosis not present

## 2014-02-01 DIAGNOSIS — I10 Essential (primary) hypertension: Secondary | ICD-10-CM | POA: Diagnosis not present

## 2014-02-03 DIAGNOSIS — T84030D Mechanical loosening of internal right hip prosthetic joint, subsequent encounter: Secondary | ICD-10-CM | POA: Diagnosis not present

## 2014-02-12 ENCOUNTER — Ambulatory Visit (INDEPENDENT_AMBULATORY_CARE_PROVIDER_SITE_OTHER): Payer: Medicare Other | Admitting: Podiatry

## 2014-02-12 ENCOUNTER — Encounter: Payer: Self-pay | Admitting: Podiatry

## 2014-02-12 VITALS — BP 111/65 | HR 74 | Resp 16 | Ht 73.0 in | Wt 255.0 lb

## 2014-02-12 DIAGNOSIS — B351 Tinea unguium: Secondary | ICD-10-CM

## 2014-02-12 DIAGNOSIS — M79676 Pain in unspecified toe(s): Secondary | ICD-10-CM | POA: Diagnosis not present

## 2014-02-12 NOTE — Progress Notes (Signed)
   Subjective:    Patient ID: Ruben Andrews, male    DOB: 09-14-1955, 58 y.o.   MRN: [id number]  HPI Comments: "This big toenail"  Mr. Beavers, 58 year old male, presents the outside with complaints of painful elongated nails which been present for several years. He particularly states that his left toe nail is long and painful with ambulation and particularly with shoe gear. He has treated the area with some over-the-counter medications for a short period of time without any resolution. He denies any redness or drainage around the nail sites. No other complaints at this time.     Review of Systems  Constitutional: Positive for activity change.  HENT: Positive for sinus pressure.   Endocrine: Positive for heat intolerance.  Musculoskeletal: Positive for gait problem.  Skin:       Change in nails  All other systems reviewed and are negative.      Objective:   Physical Exam AAO x3, NAD DP/PT pulses palpable bilaterally, CRT less than 3 seconds Protective sensation intact with Simms Weinstein monofilament, vibratory sensation intact, Achilles tendon reflex intact Nails hypertrophic, dystrophic, elongated, brittle, discolored 10 with the left hallux nail curving and abutting the second digit. There is no drainage or erythema around the nails. No open lesions. MMT 5/5, ROM WNL Pain, swelling, warmth, erythema      Assessment & Plan:  58 year old male with symptomatic onychomycosis. -Conservative versus surgical treatment discussed including alternatives, risks, complications. -Nail sharply debrided 10 without complications. -Discussed various treatment options for onychomycosis. At this time patient wishes to proceed with over-the-counter therapy. Discussed with him the treatment may need to be used for up to one year before results. He states previously he only applied the medication for short period of time. Discussed risks and complications of treating onychomycosis and directed  to stop the medication immediately if any are to occur and call the office. Monitor for any clinical signs or symptoms of infection. -Follow-up as needed. Call the office with any questions, concerns, change in symptoms.

## 2014-02-12 NOTE — Patient Instructions (Signed)

## 2014-02-15 ENCOUNTER — Encounter: Payer: Self-pay | Admitting: Podiatry

## 2014-02-23 DIAGNOSIS — C61 Malignant neoplasm of prostate: Secondary | ICD-10-CM | POA: Diagnosis not present

## 2014-02-25 ENCOUNTER — Encounter (HOSPITAL_COMMUNITY)
Admission: RE | Admit: 2014-02-25 | Discharge: 2014-02-25 | Disposition: A | Discharge status: Home / Self Care | Payer: Medicaid Other | Source: Ambulatory Visit | Attending: Urology | Admitting: Urology

## 2014-02-25 DIAGNOSIS — C61 Malignant neoplasm of prostate: Secondary | ICD-10-CM | POA: Insufficient documentation

## 2014-02-25 MED ORDER — TECHNETIUM TC 99M MEDRONATE IV KIT
25.6000 | PACK | Freq: Once | INTRAVENOUS | Status: AC | PRN
  Administered 2014-02-25: 25.6 via INTRAVENOUS

## 2014-06-21 ENCOUNTER — Other Ambulatory Visit: Payer: Self-pay | Admitting: Urology

## 2014-06-21 DIAGNOSIS — C61 Malignant neoplasm of prostate: Secondary | ICD-10-CM

## 2014-06-29 DIAGNOSIS — Z125 Encounter for screening for malignant neoplasm of prostate: Secondary | ICD-10-CM | POA: Diagnosis not present

## 2014-06-29 DIAGNOSIS — I10 Essential (primary) hypertension: Secondary | ICD-10-CM | POA: Diagnosis not present

## 2014-06-29 DIAGNOSIS — E119 Type 2 diabetes mellitus without complications: Secondary | ICD-10-CM | POA: Diagnosis not present

## 2014-06-29 DIAGNOSIS — E785 Hyperlipidemia, unspecified: Secondary | ICD-10-CM | POA: Diagnosis not present

## 2014-07-06 DIAGNOSIS — Z6835 Body mass index (BMI) 35.0-35.9, adult: Secondary | ICD-10-CM | POA: Diagnosis not present

## 2014-07-06 DIAGNOSIS — E785 Hyperlipidemia, unspecified: Secondary | ICD-10-CM | POA: Diagnosis not present

## 2014-07-06 DIAGNOSIS — J449 Chronic obstructive pulmonary disease, unspecified: Secondary | ICD-10-CM | POA: Diagnosis not present

## 2014-07-06 DIAGNOSIS — R972 Elevated prostate specific antigen [PSA]: Secondary | ICD-10-CM | POA: Diagnosis not present

## 2014-07-06 DIAGNOSIS — Z720 Tobacco use: Secondary | ICD-10-CM | POA: Diagnosis not present

## 2014-07-06 DIAGNOSIS — Z0000 Encounter for general adult medical examination without abnormal findings: Secondary | ICD-10-CM | POA: Diagnosis not present

## 2014-07-06 DIAGNOSIS — E119 Type 2 diabetes mellitus without complications: Secondary | ICD-10-CM | POA: Diagnosis not present

## 2014-07-06 DIAGNOSIS — I10 Essential (primary) hypertension: Secondary | ICD-10-CM | POA: Diagnosis not present

## 2014-07-06 DIAGNOSIS — Z1389 Encounter for screening for other disorder: Secondary | ICD-10-CM | POA: Diagnosis not present

## 2014-07-06 DIAGNOSIS — Z8546 Personal history of malignant neoplasm of prostate: Secondary | ICD-10-CM | POA: Diagnosis not present

## 2014-07-07 DIAGNOSIS — Z1212 Encounter for screening for malignant neoplasm of rectum: Secondary | ICD-10-CM | POA: Diagnosis not present

## 2014-07-20 DIAGNOSIS — T84030D Mechanical loosening of internal right hip prosthetic joint, subsequent encounter: Secondary | ICD-10-CM | POA: Diagnosis not present

## 2014-08-16 DIAGNOSIS — C61 Malignant neoplasm of prostate: Secondary | ICD-10-CM | POA: Diagnosis not present

## 2014-08-17 ENCOUNTER — Encounter (HOSPITAL_COMMUNITY)
Admission: RE | Admit: 2014-08-17 | Discharge: 2014-08-17 | Disposition: A | Discharge status: Home / Self Care | Payer: Medicare Other | Source: Ambulatory Visit | Attending: Urology | Admitting: Urology

## 2014-08-17 DIAGNOSIS — C61 Malignant neoplasm of prostate: Secondary | ICD-10-CM | POA: Insufficient documentation

## 2014-08-17 MED ORDER — TECHNETIUM TC 99M MEDRONATE IV KIT: 25.0000 | PACK | Freq: Once | INTRAVENOUS | Status: AC | PRN

## 2014-09-23 DIAGNOSIS — T84030D Mechanical loosening of internal right hip prosthetic joint, subsequent encounter: Secondary | ICD-10-CM | POA: Diagnosis not present

## 2014-09-23 DIAGNOSIS — M7062 Trochanteric bursitis, left hip: Secondary | ICD-10-CM | POA: Diagnosis not present

## 2014-10-11 ENCOUNTER — Other Ambulatory Visit: Payer: Self-pay | Admitting: Urology

## 2014-10-11 DIAGNOSIS — C61 Malignant neoplasm of prostate: Secondary | ICD-10-CM

## 2014-10-13 DIAGNOSIS — R600 Localized edema: Secondary | ICD-10-CM | POA: Diagnosis not present

## 2014-10-13 DIAGNOSIS — Z6838 Body mass index (BMI) 38.0-38.9, adult: Secondary | ICD-10-CM | POA: Diagnosis not present

## 2014-10-13 DIAGNOSIS — I10 Essential (primary) hypertension: Secondary | ICD-10-CM | POA: Diagnosis not present

## 2014-10-13 DIAGNOSIS — R05 Cough: Secondary | ICD-10-CM | POA: Diagnosis not present

## 2014-11-05 DIAGNOSIS — K219 Gastro-esophageal reflux disease without esophagitis: Secondary | ICD-10-CM | POA: Diagnosis not present

## 2014-11-05 DIAGNOSIS — Z6837 Body mass index (BMI) 37.0-37.9, adult: Secondary | ICD-10-CM | POA: Diagnosis not present

## 2014-11-05 DIAGNOSIS — E119 Type 2 diabetes mellitus without complications: Secondary | ICD-10-CM | POA: Diagnosis not present

## 2014-11-05 DIAGNOSIS — E785 Hyperlipidemia, unspecified: Secondary | ICD-10-CM | POA: Diagnosis not present

## 2014-11-05 DIAGNOSIS — I10 Essential (primary) hypertension: Secondary | ICD-10-CM | POA: Diagnosis not present

## 2015-01-27 DIAGNOSIS — C61 Malignant neoplasm of prostate: Secondary | ICD-10-CM | POA: Diagnosis not present

## 2015-01-31 ENCOUNTER — Encounter (HOSPITAL_COMMUNITY)
Admission: RE | Admit: 2015-01-31 | Discharge: 2015-01-31 | Disposition: A | Discharge status: Home / Self Care | Payer: Medicare Other | Source: Ambulatory Visit | Attending: Urology | Admitting: Urology

## 2015-01-31 DIAGNOSIS — C61 Malignant neoplasm of prostate: Secondary | ICD-10-CM

## 2015-01-31 MED ORDER — TECHNETIUM TC 99M MEDRONATE IV KIT
25.8000 | PACK | Freq: Once | INTRAVENOUS | Status: AC | PRN
  Administered 2015-01-31: 25.8 via INTRAVENOUS

## 2015-02-14 DIAGNOSIS — Z6836 Body mass index (BMI) 36.0-36.9, adult: Secondary | ICD-10-CM | POA: Diagnosis not present

## 2015-02-14 DIAGNOSIS — M160 Bilateral primary osteoarthritis of hip: Secondary | ICD-10-CM | POA: Diagnosis not present

## 2015-02-14 DIAGNOSIS — E785 Hyperlipidemia, unspecified: Secondary | ICD-10-CM | POA: Diagnosis not present

## 2015-02-14 DIAGNOSIS — F5221 Male erectile disorder: Secondary | ICD-10-CM | POA: Diagnosis not present

## 2015-02-14 DIAGNOSIS — M6282 Rhabdomyolysis: Secondary | ICD-10-CM | POA: Diagnosis not present

## 2015-02-14 DIAGNOSIS — J449 Chronic obstructive pulmonary disease, unspecified: Secondary | ICD-10-CM | POA: Diagnosis not present

## 2015-02-14 DIAGNOSIS — I10 Essential (primary) hypertension: Secondary | ICD-10-CM | POA: Diagnosis not present

## 2015-02-14 DIAGNOSIS — E119 Type 2 diabetes mellitus without complications: Secondary | ICD-10-CM | POA: Diagnosis not present

## 2015-02-14 DIAGNOSIS — Z720 Tobacco use: Secondary | ICD-10-CM | POA: Diagnosis not present

## 2015-02-15 DIAGNOSIS — M25561 Pain in right knee: Secondary | ICD-10-CM | POA: Diagnosis not present

## 2015-02-15 DIAGNOSIS — M7061 Trochanteric bursitis, right hip: Secondary | ICD-10-CM | POA: Diagnosis not present

## 2015-02-15 DIAGNOSIS — M25562 Pain in left knee: Secondary | ICD-10-CM | POA: Diagnosis not present

## 2015-03-23 DIAGNOSIS — M25552 Pain in left hip: Secondary | ICD-10-CM | POA: Diagnosis not present

## 2015-03-23 DIAGNOSIS — E119 Type 2 diabetes mellitus without complications: Secondary | ICD-10-CM | POA: Diagnosis not present

## 2015-03-23 DIAGNOSIS — R5383 Other fatigue: Secondary | ICD-10-CM | POA: Diagnosis not present

## 2015-03-23 DIAGNOSIS — I10 Essential (primary) hypertension: Secondary | ICD-10-CM | POA: Diagnosis not present

## 2015-03-23 DIAGNOSIS — R972 Elevated prostate specific antigen [PSA]: Secondary | ICD-10-CM | POA: Diagnosis not present

## 2015-03-23 DIAGNOSIS — Z6836 Body mass index (BMI) 36.0-36.9, adult: Secondary | ICD-10-CM | POA: Diagnosis not present

## 2015-03-23 DIAGNOSIS — R748 Abnormal levels of other serum enzymes: Secondary | ICD-10-CM | POA: Diagnosis not present

## 2015-03-23 DIAGNOSIS — E784 Other hyperlipidemia: Secondary | ICD-10-CM | POA: Diagnosis not present

## 2015-04-28 ENCOUNTER — Other Ambulatory Visit: Payer: Self-pay | Admitting: Urology

## 2015-04-28 DIAGNOSIS — C61 Malignant neoplasm of prostate: Secondary | ICD-10-CM

## 2015-05-02 DIAGNOSIS — I10 Essential (primary) hypertension: Secondary | ICD-10-CM | POA: Diagnosis not present

## 2015-05-02 DIAGNOSIS — E119 Type 2 diabetes mellitus without complications: Secondary | ICD-10-CM | POA: Diagnosis not present

## 2015-05-02 DIAGNOSIS — K219 Gastro-esophageal reflux disease without esophagitis: Secondary | ICD-10-CM | POA: Diagnosis not present

## 2015-05-02 DIAGNOSIS — Z6835 Body mass index (BMI) 35.0-35.9, adult: Secondary | ICD-10-CM | POA: Diagnosis not present

## 2015-06-02 DIAGNOSIS — Z0000 Encounter for general adult medical examination without abnormal findings: Secondary | ICD-10-CM | POA: Diagnosis not present

## 2015-06-02 DIAGNOSIS — N393 Stress incontinence (female) (male): Secondary | ICD-10-CM | POA: Diagnosis not present

## 2015-06-07 ENCOUNTER — Other Ambulatory Visit: Payer: Self-pay | Admitting: Internal Medicine

## 2015-06-07 DIAGNOSIS — Z6834 Body mass index (BMI) 34.0-34.9, adult: Secondary | ICD-10-CM | POA: Diagnosis not present

## 2015-06-07 DIAGNOSIS — R1900 Intra-abdominal and pelvic swelling, mass and lump, unspecified site: Secondary | ICD-10-CM

## 2015-06-07 DIAGNOSIS — Z8546 Personal history of malignant neoplasm of prostate: Secondary | ICD-10-CM | POA: Diagnosis not present

## 2015-06-07 DIAGNOSIS — R1903 Right lower quadrant abdominal swelling, mass and lump: Secondary | ICD-10-CM | POA: Diagnosis not present

## 2015-06-07 DIAGNOSIS — J069 Acute upper respiratory infection, unspecified: Secondary | ICD-10-CM | POA: Diagnosis not present

## 2015-06-08 ENCOUNTER — Ambulatory Visit
Admission: RE | Admit: 2015-06-08 | Discharge: 2015-06-08 | Disposition: A | Discharge status: Home / Self Care | Payer: Medicare Other | Source: Ambulatory Visit | Attending: Internal Medicine | Admitting: Internal Medicine

## 2015-06-08 DIAGNOSIS — R1900 Intra-abdominal and pelvic swelling, mass and lump, unspecified site: Secondary | ICD-10-CM

## 2015-06-08 DIAGNOSIS — K449 Diaphragmatic hernia without obstruction or gangrene: Secondary | ICD-10-CM | POA: Diagnosis not present

## 2015-06-08 MED ORDER — IOPAMIDOL (ISOVUE-300) INJECTION 61%
125.0000 mL | Freq: Once | INTRAVENOUS | Status: AC | PRN
  Administered 2015-06-08: 125 mL via INTRAVENOUS

## 2015-06-21 DIAGNOSIS — K4090 Unilateral inguinal hernia, without obstruction or gangrene, not specified as recurrent: Secondary | ICD-10-CM | POA: Diagnosis not present

## 2015-06-28 ENCOUNTER — Encounter: Payer: Self-pay | Admitting: Internal Medicine

## 2015-06-28 ENCOUNTER — Ambulatory Visit (INDEPENDENT_AMBULATORY_CARE_PROVIDER_SITE_OTHER): Payer: Medicare Other | Admitting: Internal Medicine

## 2015-06-28 VITALS — BP 122/80 | HR 64 | Ht 73.0 in | Wt 260.0 lb

## 2015-06-28 DIAGNOSIS — R1013 Epigastric pain: Secondary | ICD-10-CM | POA: Diagnosis not present

## 2015-06-28 DIAGNOSIS — N433 Hydrocele, unspecified: Secondary | ICD-10-CM | POA: Diagnosis not present

## 2015-06-28 DIAGNOSIS — K4090 Unilateral inguinal hernia, without obstruction or gangrene, not specified as recurrent: Secondary | ICD-10-CM | POA: Diagnosis not present

## 2015-06-28 DIAGNOSIS — K449 Diaphragmatic hernia without obstruction or gangrene: Secondary | ICD-10-CM

## 2015-06-28 NOTE — Patient Instructions (Addendum)
You have been given a separate informational sheet regarding your tobacco use, the importance of quitting and local resources to help you quit.   Call us back if you have more epigastric pain.    I appreciate the opportunity to care for you. Silvano Rusk, MD, [facility]

## 2015-06-28 NOTE — Progress Notes (Signed)
  Referred by: Velna Hatchet, MD  Subjective:    Patient ID: Ruben Andrews, male    DOB: Dec 07, 1955, 60 y.o.   MRN: [id number] Chief complaint: Abdominal problems HPI Patient is a nice man who was on a study drug for prostate cancer. He remains on it. He was having periumbilical abdominal pain and bloating and some reflux. These are side effects listed as a potential problem with the prostate cancer study drug. He had this for a while, he had a workup and evaluation of his primary care office, he went back on a PPI, and at this point the symptoms seem gone. He has had a colonoscopy by me in 2014 and 3 diminutive adenomas, a 32 or 107 year old Brother had colon cancer. Medications, allergies, past medical history, past surgical history, family history and social history are reviewed and updated in the EMR.  Review of Systems As per history of present illness all other review of systems are negative at this time.    Objective:   Physical Exam @BP  122/80 mmHg  Pulse 64  Ht 6\' 1"  (1.854 m)  Wt 260 lb (117.935 kg)  BMI 34.31 kg/m2@  General:  Well-developed, well-nourished and in no acute distress Eyes:  anicteric. Neck:   supple w/o thyromegaly or mass.  Lungs: Clear to auscultation bilaterally. Heart:  S1S2, no rubs, murmurs, gallops. Abdomen:  soft, NT right inguinal hernia into scrotum .  Lymph:  no cervical or supraclavicular adenopathy. Extremities:   no edema, cyanosis or clubbing Skin   no rash. Neuro:  A&O x 3.  Psych:  appropriate mood and  Affect.   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 Mild fatty liver. Pelvic evaluation limited due to beam artifact from hip replacements. He does have a right inguinal hernia, with fat and small bowel loops and a large hydrocele.      Assessment & Plan:   1. Abdominal pain, epigastric   2. Hiatal hernia    His symptoms seem to  be gone. I will be available if they recur. Cause not entirely clear he will continue his current treatment and see me as needed.  I appreciate the opportunity to care for this patient. CC: Velna Hatchet, MD Dr. Irine Seal

## 2015-06-30 ENCOUNTER — Encounter: Payer: Self-pay | Admitting: Internal Medicine

## 2015-06-30 ENCOUNTER — Ambulatory Visit: Payer: Self-pay | Admitting: General Surgery

## 2015-07-04 ENCOUNTER — Other Ambulatory Visit: Payer: Self-pay | Admitting: Urology

## 2015-07-05 DIAGNOSIS — E119 Type 2 diabetes mellitus without complications: Secondary | ICD-10-CM | POA: Diagnosis not present

## 2015-07-05 DIAGNOSIS — E784 Other hyperlipidemia: Secondary | ICD-10-CM | POA: Diagnosis not present

## 2015-07-13 DIAGNOSIS — C61 Malignant neoplasm of prostate: Secondary | ICD-10-CM | POA: Diagnosis not present

## 2015-07-14 ENCOUNTER — Encounter (HOSPITAL_COMMUNITY)
Admission: RE | Admit: 2015-07-14 | Discharge: 2015-07-14 | Disposition: A | Discharge status: Home / Self Care | Payer: Medicare Other | Source: Ambulatory Visit | Attending: Urology | Admitting: Urology

## 2015-07-14 DIAGNOSIS — C61 Malignant neoplasm of prostate: Secondary | ICD-10-CM

## 2015-07-14 MED ORDER — TECHNETIUM TC 99M MEDRONATE IV KIT
25.0000 | PACK | Freq: Once | INTRAVENOUS | Status: AC | PRN
  Administered 2015-07-14: 26.6 via INTRAVENOUS

## 2015-07-15 ENCOUNTER — Encounter (HOSPITAL_BASED_OUTPATIENT_CLINIC_OR_DEPARTMENT_OTHER): Payer: Self-pay | Admitting: *Deleted

## 2015-07-15 NOTE — Progress Notes (Signed)
To [facility] at 0600 -Istat,Ekg on arrival-instructed Npo after Mn-refrain from smoking also-Hibiclens shower evening prior and am of surgery.

## 2015-07-15 NOTE — Anesthesia Preprocedure Evaluation (Addendum)
Anesthesia Evaluation  Patient identified by MRN, date of birth, ID band Patient awake    Reviewed: Allergy & Precautions, NPO status , Patient's Chart, lab work & pertinent test results  Airway Mallampati: II   Neck ROM: Full    Dental  (+) Partial Upper, Dental Advisory Given,    Pulmonary neg pulmonary ROS, Current Smoker,    breath sounds clear to auscultation       Cardiovascular hypertension, Pt. on medications negative cardio ROS   Rhythm:Regular  19-Jul-2015 06:18:47 [place] EKG Normal sinus rhythm   Neuro/Psych Depression negative neurological ROS  negative psych ROS   GI/Hepatic negative GI ROS, Neg liver ROS, GERD  Medicated,  Endo/Other  negative endocrine ROS  Renal/GU negative Renal ROS   HX prostate CA negative genitourinary   Musculoskeletal negative musculoskeletal ROS (+)   Abdominal   Peds negative pediatric ROS (+)  Hematology negative hematology ROS (+)   Anesthesia Other Findings   Reproductive/Obstetrics negative OB ROS                          Anesthesia Physical Anesthesia Plan  ASA: II  Anesthesia Plan: General   Post-op Pain Management:    Induction: Intravenous  Airway Management Planned: LMA and Oral ETT  Additional Equipment:   Intra-op Plan:   Post-operative Plan: Extubation in OR  Informed Consent: I have reviewed the patients History and Physical, chart, labs and discussed the procedure including the risks, benefits and alternatives for the proposed anesthesia with the patient or authorized representative who has indicated his/her understanding and acceptance.     Plan Discussed with:   Anesthesia Plan Comments: (Multimodal pain RX)        Anesthesia Quick Evaluation

## 2015-07-18 NOTE — H&P (Signed)
  History of Present Illness Ruben Andrews presents today for physical exam as part of his involvement in the [facility] study. Recent imaging studies indicated no skeletal metastases.      He is scheduled to have bilateral hydrocele repair next week with his urologist. Denies recent fever, dysuria, hematuria, renal colic, or other signs and symptoms of systemic infection.   Past Medical History Problems  1. History of arthritis (Z87.39) 2. History of diabetes mellitus (Z86.39) 3. History of hypertension (Z86.79)  Surgical History Problems  1. History of Prostate Surgery 2. History of Radiation Therapy 3. History of Total Hip Replacement 4. History of Total Hip Replacement  Current Meds 1. Lisinopril 10 MG Oral Tablet;  Therapy: (Recorded:11Jan2017) to Recorded 2. OxyCODONE HCl CAPS;  Therapy: (Recorded:14Mar2017) to Recorded 3. Sildenafil Citrate 20 MG Oral Tablet; take  2   -   5  tablets as needed and  instructed;  Therapy: [phone] to (Last Rx:19Oct2016)  Requested for: 19Oct2016 Ordered 4. Vitamin D CAPS;  Therapy: (Recorded:11Jan2017) to Recorded  Allergies Medication  1. No Known Drug Allergies  Family History Problems  1. Family history of Death : Mother, Father, Brother  Social History Problems  1. Caffeine use (F15.90) 2. Current every day smoker (F17.200) 3. Disabled 4. No alcohol use 5. Number of children 6. Single  Physical Exam Constitutional: Well nourished and well developed . No acute distress.  ENT:. The ears and nose are normal in appearance.  Neck: The appearance of the neck is normal and no neck mass is present.  Pulmonary: No respiratory distress and normal respiratory rhythm and effort.  Cardiovascular: Heart rate and rhythm are normal . No peripheral edema.  Abdomen: The abdomen is soft and nontender. No masses are palpated. No CVA tenderness. No hernias are palpable. No hepatosplenomegaly noted.  Genitourinary: Examination of the penis  demonstrates no discharge, no masses, no lesions and a normal meatus. The scrotum is without lesions. Examination of the right scrotum demonstrates a hydrocele. Examination of the left scrotum demostrates a hydrocele. The right epididymis is palpably normal and non-tender. The left epididymis is palpably normal and non-tender. The right testis is nonpalpable, non-tender and without masses. The left testis is nonpalpable, non-tender and without masses.  Lymphatics: The femoral and inguinal nodes are not enlarged or tender.  Skin: Normal skin turgor, no visible rash and no visible skin lesions.  Neuro/Psych:. Mood and affect are appropriate.    Assessment Assessed  1. Prostate cancer (C61)  Plan Continue with enrollment in research program.  Benign physical exam other than the hydrocele. From my perspective I see no reason for him not to proceed with the surgical procedure next week.

## 2015-07-19 ENCOUNTER — Encounter (HOSPITAL_BASED_OUTPATIENT_CLINIC_OR_DEPARTMENT_OTHER)
Admission: RE | Disposition: A | Discharge status: Home / Self Care | Payer: Self-pay | Source: Ambulatory Visit | Attending: Urology

## 2015-07-19 ENCOUNTER — Ambulatory Visit (HOSPITAL_BASED_OUTPATIENT_CLINIC_OR_DEPARTMENT_OTHER)
Admission: RE | Admit: 2015-07-19 | Discharge: 2015-07-19 | Disposition: A | Discharge status: Home / Self Care | Payer: Medicare Other | Source: Ambulatory Visit | Attending: Urology | Admitting: Urology

## 2015-07-19 ENCOUNTER — Encounter (HOSPITAL_BASED_OUTPATIENT_CLINIC_OR_DEPARTMENT_OTHER): Payer: Self-pay | Admitting: *Deleted

## 2015-07-19 ENCOUNTER — Other Ambulatory Visit: Payer: Self-pay

## 2015-07-19 ENCOUNTER — Ambulatory Visit (HOSPITAL_BASED_OUTPATIENT_CLINIC_OR_DEPARTMENT_OTHER): Payer: Medicare Other | Admitting: Anesthesiology

## 2015-07-19 DIAGNOSIS — Z96643 Presence of artificial hip joint, bilateral: Secondary | ICD-10-CM | POA: Insufficient documentation

## 2015-07-19 DIAGNOSIS — Z79899 Other long term (current) drug therapy: Secondary | ICD-10-CM | POA: Diagnosis not present

## 2015-07-19 DIAGNOSIS — Z8546 Personal history of malignant neoplasm of prostate: Secondary | ICD-10-CM | POA: Insufficient documentation

## 2015-07-19 DIAGNOSIS — F17200 Nicotine dependence, unspecified, uncomplicated: Secondary | ICD-10-CM | POA: Diagnosis not present

## 2015-07-19 DIAGNOSIS — K4090 Unilateral inguinal hernia, without obstruction or gangrene, not specified as recurrent: Secondary | ICD-10-CM | POA: Diagnosis not present

## 2015-07-19 DIAGNOSIS — N433 Hydrocele, unspecified: Secondary | ICD-10-CM | POA: Insufficient documentation

## 2015-07-19 DIAGNOSIS — E119 Type 2 diabetes mellitus without complications: Secondary | ICD-10-CM | POA: Insufficient documentation

## 2015-07-19 DIAGNOSIS — I10 Essential (primary) hypertension: Secondary | ICD-10-CM | POA: Diagnosis not present

## 2015-07-19 DIAGNOSIS — Z79891 Long term (current) use of opiate analgesic: Secondary | ICD-10-CM | POA: Insufficient documentation

## 2015-07-19 DIAGNOSIS — Z923 Personal history of irradiation: Secondary | ICD-10-CM | POA: Diagnosis not present

## 2015-07-19 DIAGNOSIS — K219 Gastro-esophageal reflux disease without esophagitis: Secondary | ICD-10-CM | POA: Diagnosis not present

## 2015-07-19 HISTORY — DX: Reserved for inherently not codable concepts without codable children: IMO0001

## 2015-07-19 HISTORY — PX: HYDROCELE EXCISION: SHX482

## 2015-07-19 HISTORY — PX: INGUINAL HERNIA REPAIR: SHX194

## 2015-07-19 HISTORY — PX: INSERTION OF MESH: SHX5868

## 2015-07-19 LAB — POCT I-STAT 4, (NA,K, GLUC, HGB,HCT)
Glucose, Bld: 121 mg/dL — ABNORMAL HIGH (ref 65–99)
HCT: 41 % (ref 39.0–52.0)
Hemoglobin: 13.9 g/dL (ref 13.0–17.0)
Potassium: 3.9 mmol/L (ref 3.5–5.1)
Sodium: 139 mmol/L (ref 135–145)

## 2015-07-19 SURGERY — HYDROCELECTOMY
Anesthesia: General | Site: Inguinal | Laterality: Right

## 2015-07-19 MED ORDER — STERILE WATER FOR INJECTION IJ SOLN
INTRAMUSCULAR | Status: AC
Start: 2015-07-19 — End: 2015-07-19
  Filled 2015-07-19: qty 20

## 2015-07-19 MED ORDER — DEXAMETHASONE SODIUM PHOSPHATE 4 MG/ML IJ SOLN
INTRAMUSCULAR | Status: DC | PRN
  Administered 2015-07-19: 10 mg via INTRAVENOUS

## 2015-07-19 MED ORDER — ONDANSETRON HCL 4 MG/2ML IJ SOLN
INTRAMUSCULAR | Status: AC
  Filled 2015-07-19: qty 2

## 2015-07-19 MED ORDER — 0.9 % SODIUM CHLORIDE (POUR BTL) OPTIME
TOPICAL | Status: DC | PRN
  Administered 2015-07-19: 500 mL

## 2015-07-19 MED ORDER — PROPOFOL 10 MG/ML IV BOLUS
INTRAVENOUS | Status: AC
  Filled 2015-07-19: qty 40

## 2015-07-19 MED ORDER — LIDOCAINE HCL (CARDIAC) 20 MG/ML IV SOLN
INTRAVENOUS | Status: AC
  Filled 2015-07-19: qty 5

## 2015-07-19 MED ORDER — PROMETHAZINE HCL 25 MG/ML IJ SOLN
INTRAMUSCULAR | Status: AC
Start: 2015-07-19 — End: 2015-07-19
  Filled 2015-07-19: qty 1

## 2015-07-19 MED ORDER — OXYCODONE HCL 5 MG PO TABS
5.0000 mg | ORAL_TABLET | ORAL | Status: DC | PRN
  Filled 2015-07-19: qty 2

## 2015-07-19 MED ORDER — GLYCOPYRROLATE 0.2 MG/ML IJ SOLN
INTRAMUSCULAR | Status: DC | PRN
  Administered 2015-07-19: .4 mg via INTRAVENOUS

## 2015-07-19 MED ORDER — ROCURONIUM BROMIDE 100 MG/10ML IV SOLN
INTRAVENOUS | Status: DC | PRN
  Administered 2015-07-19: 5 mg via INTRAVENOUS
  Administered 2015-07-19: 35 mg via INTRAVENOUS
  Administered 2015-07-19 (×3): 10 mg via INTRAVENOUS
  Administered 2015-07-19: 30 mg via INTRAVENOUS

## 2015-07-19 MED ORDER — FENTANYL CITRATE (PF) 100 MCG/2ML IJ SOLN
25.0000 ug | INTRAMUSCULAR | Status: DC | PRN
  Filled 2015-07-19: qty 1

## 2015-07-19 MED ORDER — FENTANYL CITRATE (PF) 250 MCG/5ML IJ SOLN
INTRAMUSCULAR | Status: AC
  Filled 2015-07-19: qty 10

## 2015-07-19 MED ORDER — SODIUM CHLORIDE 0.9% FLUSH
3.0000 mL | INTRAVENOUS | Status: DC | PRN
  Filled 2015-07-19: qty 3

## 2015-07-19 MED ORDER — LACTATED RINGERS IV SOLN
INTRAVENOUS | Status: DC
  Administered 2015-07-19 (×3): via INTRAVENOUS
  Filled 2015-07-19: qty 1000

## 2015-07-19 MED ORDER — MEPERIDINE HCL 25 MG/ML IJ SOLN
6.2500 mg | INTRAMUSCULAR | Status: DC | PRN
  Filled 2015-07-19: qty 1

## 2015-07-19 MED ORDER — ACETAMINOPHEN 650 MG RE SUPP
650.0000 mg | RECTAL | Status: DC | PRN
  Filled 2015-07-19: qty 1

## 2015-07-19 MED ORDER — ARTIFICIAL TEARS OP OINT
TOPICAL_OINTMENT | OPHTHALMIC | Status: AC
  Filled 2015-07-19: qty 3.5

## 2015-07-19 MED ORDER — FENTANYL CITRATE (PF) 100 MCG/2ML IJ SOLN
INTRAMUSCULAR | Status: DC | PRN
  Administered 2015-07-19 (×5): 50 ug via INTRAVENOUS

## 2015-07-19 MED ORDER — SODIUM CHLORIDE 0.9% FLUSH
3.0000 mL | Freq: Two times a day (BID) | INTRAVENOUS | Status: DC
  Filled 2015-07-19: qty 3

## 2015-07-19 MED ORDER — CHLORHEXIDINE GLUCONATE 4 % EX LIQD
1.0000 "application " | Freq: Once | CUTANEOUS | Status: DC
  Filled 2015-07-19: qty 15

## 2015-07-19 MED ORDER — SODIUM CHLORIDE 0.9 % IV SOLN
250.0000 mL | INTRAVENOUS | Status: DC | PRN
  Filled 2015-07-19: qty 250

## 2015-07-19 MED ORDER — NEOSTIGMINE METHYLSULFATE 10 MG/10ML IV SOLN
INTRAVENOUS | Status: DC | PRN
  Administered 2015-07-19: 4 mg via INTRAVENOUS

## 2015-07-19 MED ORDER — SODIUM CHLORIDE 0.9 % IV SOLN
INTRAVENOUS | Status: DC | PRN
  Administered 2015-07-19: 40 mL

## 2015-07-19 MED ORDER — BUPIVACAINE HCL (PF) 0.25 % IJ SOLN
INTRAMUSCULAR | Status: DC | PRN
Start: 2015-07-19 — End: 2015-07-19
  Administered 2015-07-19: 9 mL
  Administered 2015-07-19: 8 mL
  Administered 2015-07-19: 10 mL

## 2015-07-19 MED ORDER — GLYCOPYRROLATE 0.2 MG/ML IJ SOLN
INTRAMUSCULAR | Status: AC
  Filled 2015-07-19: qty 2

## 2015-07-19 MED ORDER — MIDAZOLAM HCL 2 MG/2ML IJ SOLN
INTRAMUSCULAR | Status: AC
  Filled 2015-07-19: qty 2

## 2015-07-19 MED ORDER — DEXAMETHASONE SODIUM PHOSPHATE 10 MG/ML IJ SOLN
INTRAMUSCULAR | Status: AC
  Filled 2015-07-19: qty 1

## 2015-07-19 MED ORDER — CEFAZOLIN SODIUM-DEXTROSE 2-3 GM-% IV SOLR
INTRAVENOUS | Status: AC
  Filled 2015-07-19: qty 50

## 2015-07-19 MED ORDER — ONDANSETRON HCL 4 MG/2ML IJ SOLN
INTRAMUSCULAR | Status: DC | PRN
  Administered 2015-07-19: 4 mg via INTRAVENOUS

## 2015-07-19 MED ORDER — ONDANSETRON HCL 4 MG/2ML IJ SOLN
INTRAMUSCULAR | Status: AC
Start: 2015-07-19 — End: 2015-07-19
  Filled 2015-07-19: qty 2

## 2015-07-19 MED ORDER — EPHEDRINE SULFATE 50 MG/ML IJ SOLN
INTRAMUSCULAR | Status: DC | PRN
  Administered 2015-07-19: 10 mg via INTRAVENOUS

## 2015-07-19 MED ORDER — PROMETHAZINE HCL 25 MG/ML IJ SOLN
6.2500 mg | INTRAMUSCULAR | Status: DC | PRN
  Administered 2015-07-19: 6.25 mg via INTRAVENOUS
  Filled 2015-07-19: qty 1

## 2015-07-19 MED ORDER — LIDOCAINE HCL (CARDIAC) 20 MG/ML IV SOLN
INTRAVENOUS | Status: DC | PRN
  Administered 2015-07-19: 100 mg via INTRAVENOUS

## 2015-07-19 MED ORDER — PROPOFOL 10 MG/ML IV BOLUS
INTRAVENOUS | Status: DC | PRN
  Administered 2015-07-19: 50 mg via INTRAVENOUS
  Administered 2015-07-19: 100 mg via INTRAVENOUS
  Administered 2015-07-19: 200 mg via INTRAVENOUS

## 2015-07-19 MED ORDER — NEOSTIGMINE METHYLSULFATE 10 MG/10ML IV SOLN
INTRAVENOUS | Status: AC
  Filled 2015-07-19: qty 1

## 2015-07-19 MED ORDER — MIDAZOLAM HCL 5 MG/5ML IJ SOLN
INTRAMUSCULAR | Status: DC | PRN
  Administered 2015-07-19: 2 mg via INTRAVENOUS

## 2015-07-19 MED ORDER — EPHEDRINE SULFATE 50 MG/ML IJ SOLN
INTRAMUSCULAR | Status: AC
  Filled 2015-07-19: qty 1

## 2015-07-19 MED ORDER — ACETAMINOPHEN 325 MG PO TABS
650.0000 mg | ORAL_TABLET | ORAL | Status: DC | PRN
  Filled 2015-07-19: qty 2

## 2015-07-19 MED ORDER — OXYCODONE-ACETAMINOPHEN 5-325 MG PO TABS: 1.0000 | ORAL_TABLET | ORAL | Status: DC | PRN

## 2015-07-19 MED ORDER — DEXTROSE 5 % IV SOLN
2.0000 g | INTRAVENOUS | Status: AC
  Administered 2015-07-19: 2 g via INTRAVENOUS
  Filled 2015-07-19: qty 20

## 2015-07-19 SURGICAL SUPPLY — 78 items
APL SKNCLS STERI-STRIP NONHPOA (GAUZE/BANDAGES/DRESSINGS)
BENZOIN TINCTURE PRP APPL 2/3 (GAUZE/BANDAGES/DRESSINGS) IMPLANT
BLADE CLIPPER SURG (BLADE) ×5 IMPLANT
BLADE HEX COATED 2.75 (ELECTRODE) ×7 IMPLANT
BLADE SURG 15 STRL LF DISP TIS (BLADE) ×3 IMPLANT
BLADE SURG 15 STRL SS (BLADE) ×10
BLADE SURG ROTATE 9660 (MISCELLANEOUS) IMPLANT
BNDG GAUZE ELAST 4 BULKY (GAUZE/BANDAGES/DRESSINGS) ×5 IMPLANT
CANISTER SUCTION 1200CC (MISCELLANEOUS) IMPLANT
CANISTER SUCTION 2500CC (MISCELLANEOUS) ×2 IMPLANT
CELLS DAT CNTRL 66122 CELL SVR (MISCELLANEOUS) IMPLANT
CHLORAPREP W/TINT 26ML (MISCELLANEOUS) ×5 IMPLANT
CLEANER CAUTERY TIP 5X5 PAD (MISCELLANEOUS) IMPLANT
CLOSURE WOUND 1/2 X4 (GAUZE/BANDAGES/DRESSINGS)
COVER BACK TABLE 60X90IN (DRAPES) ×8 IMPLANT
COVER LIGHT HANDLE  1/PK (MISCELLANEOUS)
COVER LIGHT HANDLE 1/PK (MISCELLANEOUS) ×3 IMPLANT
COVER MAYO STAND STRL (DRAPES) ×8 IMPLANT
DECANTER SPIKE VIAL GLASS SM (MISCELLANEOUS) IMPLANT
DISSECTOR ROUND CHERRY 3/8 STR (MISCELLANEOUS) IMPLANT
DRAIN PENROSE 18X1/2 LTX STRL (DRAIN) ×4 IMPLANT
DRAIN PENROSE 18X1/4 LTX STRL (WOUND CARE) ×2 IMPLANT
DRAPE LAPAROSCOPIC ABDOMINAL (DRAPES) ×5 IMPLANT
DRAPE LAPAROTOMY 100X72 PEDS (DRAPES) ×2 IMPLANT
DRAPE UTILITY XL STRL (DRAPES) ×5 IMPLANT
ELECT REM PT RETURN 9FT ADLT (ELECTROSURGICAL) ×5
ELECTRODE REM PT RTRN 9FT ADLT (ELECTROSURGICAL) ×3 IMPLANT
GAUZE SPONGE 4X4 16PLY XRAY LF (GAUZE/BANDAGES/DRESSINGS) ×6 IMPLANT
GLOVE BIOGEL PI IND STRL 7.0 (GLOVE) ×3 IMPLANT
GLOVE BIOGEL PI INDICATOR 7.0 (GLOVE) ×2
GLOVE SURG SS PI 7.0 STRL IVOR (GLOVE) ×5 IMPLANT
GLOVE SURG SS PI 8.0 STRL IVOR (GLOVE) ×5 IMPLANT
GOWN STRL REUS W/ TWL LRG LVL3 (GOWN DISPOSABLE) ×9 IMPLANT
GOWN STRL REUS W/ TWL XL LVL3 (GOWN DISPOSABLE) ×3 IMPLANT
GOWN STRL REUS W/TWL LRG LVL3 (GOWN DISPOSABLE) ×15
GOWN STRL REUS W/TWL XL LVL3 (GOWN DISPOSABLE) ×5
IV CATH 18GX1.25 SAFE RETR GRN (IV SOLUTION) ×2 IMPLANT
KIT ROOM TURNOVER WOR (KITS) ×7 IMPLANT
LIQUID BAND (GAUZE/BANDAGES/DRESSINGS) ×3 IMPLANT
MESH BARD SOFT 3X6IN (Mesh General) ×2 IMPLANT
NDL HYPO 25X1 1.5 SAFETY (NEEDLE) ×6 IMPLANT
NEEDLE HYPO 25X1 1.5 SAFETY (NEEDLE) ×10 IMPLANT
NS IRRIG 500ML POUR BTL (IV SOLUTION) ×8 IMPLANT
PACK BASIN DAY SURGERY FS (CUSTOM PROCEDURE TRAY) ×8 IMPLANT
PAD ARMBOARD 7.5X6 YLW CONV (MISCELLANEOUS) ×5 IMPLANT
PAD CLEANER CAUTERY TIP 5X5 (MISCELLANEOUS)
PENCIL BUTTON HOLSTER BLD 10FT (ELECTRODE) ×8 IMPLANT
RETRACTOR WND ALEXIS 18 MED (MISCELLANEOUS) IMPLANT
RTRCTR WOUND ALEXIS 18CM MED (MISCELLANEOUS)
RTRCTR WOUND ALEXIS 18CM SML (INSTRUMENTS)
SAVER CELL AAL HAEMONETICS (INSTRUMENTS) IMPLANT
SPONGE GAUZE 4X4 12PLY STER LF (GAUZE/BANDAGES/DRESSINGS) ×5 IMPLANT
SPONGE LAP 4X18 X RAY DECT (DISPOSABLE) ×5 IMPLANT
STRIP CLOSURE SKIN 1/2X4 (GAUZE/BANDAGES/DRESSINGS) ×2 IMPLANT
SUPPORT SCROTAL LG STRP (MISCELLANEOUS) ×4 IMPLANT
SUPPORTER ATHLETIC LG (MISCELLANEOUS) ×1
SUT CHROMIC 3 0 SH 27 (SUTURE) ×14 IMPLANT
SUT MNCRL AB 4-0 PS2 18 (SUTURE) ×5 IMPLANT
SUT PROLENE 0 CT 1 CR/8 (SUTURE) ×3 IMPLANT
SUT PROLENE 2 0 CT2 30 (SUTURE) ×2 IMPLANT
SUT PROLENE 2 0 SH DA (SUTURE) ×3 IMPLANT
SUT VIC AB 2-0 SH 27 (SUTURE) ×10
SUT VIC AB 2-0 SH 27X BRD (SUTURE) ×3 IMPLANT
SUT VIC AB 2-0 SH 27XBRD (SUTURE) ×2 IMPLANT
SUT VIC AB 3-0 SH 27 (SUTURE) ×10
SUT VIC AB 3-0 SH 27X BRD (SUTURE) ×3 IMPLANT
SUT VIC AB 3-0 SH 27XBRD (SUTURE) ×2 IMPLANT
SYR CONTROL 10ML LL (SYRINGE) ×2 IMPLANT
SYRINGE 60CC LL (MISCELLANEOUS) ×3 IMPLANT
SYRINGE CONTROL L 12CC (SYRINGE) ×10 IMPLANT
SYRINGE CONTROL LL 12CC (SYRINGE) ×3 IMPLANT
TOWEL NATURAL 6PK STERILE (DISPOSABLE) ×5 IMPLANT
TOWEL OR 17X24 6PK STRL BLUE (TOWEL DISPOSABLE) ×17 IMPLANT
TRAY DSU PREP LF (CUSTOM PROCEDURE TRAY) ×5 IMPLANT
TUBE CONNECTING 12'X1/4 (SUCTIONS) ×2
TUBE CONNECTING 12X1/4 (SUCTIONS) ×8 IMPLANT
WATER STERILE IRR 500ML POUR (IV SOLUTION) ×5 IMPLANT
YANKAUER SUCT BULB TIP NO VENT (SUCTIONS) ×8 IMPLANT

## 2015-07-19 NOTE — Transfer of Care (Signed)
Immediate Anesthesia Transfer of Care Note  Patient: Ruben Andrews  Procedure(s) Performed: Procedure(s): HYDROCELECTOMY ADULT BILATERAL  (Bilateral) HERNIA REPAIR INGUINAL ADULT (Right) INSERTION OF MESH (Right)  Patient Location: PACU  Anesthesia Type:General  Level of Consciousness: awake, alert , oriented and patient cooperative  Airway & Oxygen Therapy: Patient Spontanous Breathing and Patient connected to face mask oxygen  Post-op Assessment: Report given to RN and Post -op Vital signs reviewed and stable  Post vital signs: Reviewed and stable  Last Vitals:  Filed Vitals:   07/19/15 0615  BP: 131/78  Pulse: 62  Temp: 36.6 C  Resp: 18    Complications: No apparent anesthesia complications

## 2015-07-19 NOTE — Op Note (Signed)
Preoperative diagnosis: right inguinal hernia  Postoperative diagnosis: right indirect inguinal hernia   Procedure: open right inguinal hernia repair with mesh  Surgeon: Gurney Maxin, M.D.  Asst: none (done at same time as hydrocelectomy by Dr. Jeffie Pollock)  Anesthesia: General  Indications for procedure: Ruben Andrews is a 60 y.o. year old male with symptoms of right groin pain especially with heavy lifting. After discussing options of repair versus observation, we decided to proceed with a right open right with mesh due to his previous prostatectomy limiting the ability to perform a laparoscopic approach.  Description of procedure: The patient was brought into the operative suite. Anesthesia was administered with General endotracheal anesthesia. WHO checklist was applied. The patient was then placed in supine. The area was prepped and draped in the usual sterile fashion.  Dr. Jeffie Pollock proceed with a hydrocelectomy, please see his report for further details.  Next the ASIS and pubic tubercle were identified. An oblique incision was made 1cm cephalad to the inguinal ligament, cautery was used to dissect down through campers and scarpa's fascia. The spermatic cord was identified and dissected free in 360 degrees. In doing this the entire right testicle came up into the field. From here, the hernia sac was identified and isolated free of the vas deferens and vessels.   Next, I observed the floor of this area was not consistent with the inguinal canal and that I misidentified scarpa's fascia as the external abdominal oblique fascia. Therefore, the external abdominal oblique fascia was identified and sharply cut from the external inguinal ring moving laterally. I again dissected the spermatic cord free in 360 degrees and ensured the inguinal ligament was free from the pubic tubercle to the ASIS. The hernia sac was dissected free back to the internal inguinal ring. At this point a 3-0 vicryl was used to  suture the sac closed and the excess was cut and removed.  Further dissection was performed to free the conjoint tendon from surrounding structures. At this point, a 3"x6" Bard soft mesh was sutured to the lacunar ligament with a 2-0 prolene and then the inguinal ligament in running fashion. An additional 2-0 prolene was used to suture the superior edge of the mesh to the conjoint tendon. A slit was cut into the mesh to allow the spermatic cord to sit in a neutral position and the tails were sutured together with a 2-0 prolene. The tails were then placed deep to the external abdominal oblique fascia. The external abdominal oblique fascia was closed with a 2-0 vicryl in running fashion. Scarpa's fascia was closed with a 3-0 vicryl. 4-0 monocryl was used to close the skin in subcuticular fashion. A 1:1 mixture of exparel and saline was injected 2cm medial and 2cm inferior to the ASIS as well as the subcutaneous area. Dermabond was put in place for dressing. The patient then awoke from anesthesia and was brought to pacu in stable condition.  Findings: large right indirect inguinal hernia  Specimen: none  Implant: 3"x6" Bard Soft Mesh  Blood loss: <49ml  Local anesthesia: 11ml 1:1 Exparel:saline  Complications: none  Gurney Maxin, M.D. General, Bariatric, & Minimally Invasive Surgery [facility]

## 2015-07-19 NOTE — Brief Op Note (Signed)
07/19/2015  8:50 AM  PATIENT:  Ruben Andrews  60 y.o. male  PRE-OPERATIVE DIAGNOSIS:  bilateral hydrocele  POST-OPERATIVE DIAGNOSIS:  bilateral hydrocele; right inguinal hernia  PROCEDURE:  Procedure(s): HYDROCELECTOMY ADULT BILATERAL  (Bilateral) HERNIA REPAIR INGUINAL ADULT (N/A) INSERTION OF MESH (N/A)  SURGEON:  Surgeon(s) and Role: Panel 1:    * Irine Seal, MD - Primary  Panel 2:    * Mickeal Skinner, MD - Primary  PHYSICIAN ASSISTANT:   ASSISTANTS: none   ANESTHESIA:   general  EBL:  Total I/O In: 1000 [I.V.:1000] Out: 5 [Blood:5]  BLOOD ADMINISTERED:none  DRAINS: Penrose drain in the inferior scrotum   LOCAL MEDICATIONS USED:  MARCAINE     SPECIMEN:  No Specimen  DISPOSITION OF SPECIMEN:  N/A  COUNTS:  YES  TOURNIQUET:  * No tourniquets in log *  DICTATION: .Other Dictation: Dictation Number [id number]  PLAN OF CARE: Discharge to home after PACU  PATIENT DISPOSITION:  PACU - hemodynamically stable.   Delay start of Pharmacological VTE agent (>24hrs) due to surgical blood loss or risk of bleeding: not applicable

## 2015-07-19 NOTE — Discharge Instructions (Addendum)
Hydrocelectomy, Care After Refer to this sheet in the next few weeks. These instructions provide you with information about caring for yourself after your procedure. Your health care provider may also give you more specific instructions. Your treatment has been planned according to current medical practices, but problems sometimes occur. Call your health care provider if you have any problems or questions after your procedure. WHAT TO EXPECT AFTER THE PROCEDURE After your procedure, it is common for the pouch that holds your testicles (scrotum) to be painful, swollen, and bruised. HOME CARE INSTRUCTIONS Bathing  Ask your health care provider when you can shower, take baths, or go swimming.  If you were told to wear an athletic support strap, take it off when you shower or take a bath. Incision Care  Follow instructions from your health care provider about how to take care of your incision. Make sure you:  Wash your hands with soap and water before you change your bandage (dressing). If soap and water are not available, use hand sanitizer.  Change your dressing as told by your health care provider.  Leave stitches (sutures) in place.  Check your incision and scrotum every day for signs of infection. Check for:  More redness, swelling, or pain.  Blood or fluid.  Warmth.  Pus or a bad smell. Managing Pain, Stiffness, and Swelling  If directed, apply ice to the injured area:  Put ice in a plastic bag.  Place a towel between your skin and the bag.  Leave the ice on for 20 minutes, 2-3 times per day. Driving  Do not drive for 24 hours if you received a sedative.  Do not drive or operate heavy machinery while taking prescription pain medicine.  Ask your health care provider when it is safe to drive. Activity  Do not do any activities that require great strength and energy (are vigorous) for as long as told by your health care provider.  Return to your normal activities as  told by your health care provider. Ask your health care provider what activities are safe for you.  Do not lift anything that is heavier than 10 lb (4.5 kg) until your health care provider says that it is safe. General Instructions  Take over-the-counter and prescription medicines only as told by your health care provider.  Keep all follow-up visits as told by your health care provider. This is important.  If you were given an athletic support strap, wear it as told by your health care provider.  If you had a drain put in during the procedure, you will need to return to have it removed. SEEK MEDICAL CARE IF:  Your pain gets worse.  You have more redness, swelling, or pain around your scrotum.  You have blood or fluid coming from your scrotum.  Your incision feels warm to the touch.  You have pus or a bad smell coming from your scrotum.  You have a fever.  You may remove your drain in the morning.  It should just slide out but if you have trouble please come to the office.   You can replace the dressing after removal with 4x4 gauze from the pharmacy.     This information is not intended to replace advice given to you by your health care provider. Make sure you discuss any questions you have with your health care provider.   Document Released: 12/22/2014 Document Reviewed: 09/29/2014 Elsevier Interactive Patient Education 2016 [place] Care Instructions  Activity: Get  plenty of rest for the remainder of the day. A responsible adult should stay with you for 24 hours following the procedure.  For the next 24 hours, DO NOT: -Drive a car -Paediatric nurse -Drink alcoholic beverages -Take any medication unless instructed by your physician -Make any legal decisions or sign important papers.  Meals: Start with liquid foods such as gelatin or soup. Progress to regular foods as tolerated. Avoid greasy, spicy, heavy foods. If nausea and/or vomiting occur,  drink only clear liquids until the nausea and/or vomiting subsides. Call your physician if vomiting continues.  Special Instructions/Symptoms: Your throat may feel dry or sore from the anesthesia or the breathing tube placed in your throat during surgery. If this causes discomfort, gargle with warm salt water. The discomfort should disappear within 24 hours.  If you had a scopolamine patch placed behind your ear for the management of post- operative nausea and/or vomiting:  1. The medication in the patch is effective for 72 hours, after which it should be removed.  Wrap patch in a tissue and discard in the trash. Wash hands thoroughly with soap and water. 2. You may remove the patch earlier than 72 hours if you experience unpleasant side effects which may include dry mouth, dizziness or visual disturbances. 3. Avoid touching the patch. Wash your hands with soap and water after contact with the patch.

## 2015-07-19 NOTE — Anesthesia Postprocedure Evaluation (Signed)
Anesthesia Post Note  Patient: Ruben Andrews  Procedure(s) Performed: Procedure(s) (LRB): HYDROCELECTOMY ADULT BILATERAL  (Bilateral) HERNIA REPAIR INGUINAL ADULT (Right) INSERTION OF MESH (Right)  Patient location during evaluation: PACU Anesthesia Type: General Level of consciousness: awake and alert Pain management: pain level controlled Vital Signs Assessment: post-procedure vital signs reviewed and stable Respiratory status: spontaneous breathing, nonlabored ventilation, respiratory function stable and patient connected to nasal cannula oxygen Cardiovascular status: blood pressure returned to baseline and stable Postop Assessment: no signs of nausea or vomiting Anesthetic complications: no    Last Vitals:  Filed Vitals:   07/19/15 1129 07/19/15 1130  BP:  134/77  Pulse: 55 54  Temp:    Resp: 19 18    Last Pain:  Filed Vitals:   07/19/15 1143  PainSc: Asleep                 Alexis Frock

## 2015-07-19 NOTE — Anesthesia Procedure Notes (Signed)
Procedure Name: Intubation Date/Time: 07/19/2015 7:46 AM Performed by: Wanita Chamberlain Pre-anesthesia Checklist: Patient identified, Timeout performed, Emergency Drugs available, Suction available and Patient being monitored Patient Re-evaluated:Patient Re-evaluated prior to inductionOxygen Delivery Method: Circle system utilized Preoxygenation: Pre-oxygenation with 100% oxygen Intubation Type: IV induction Ventilation: Mask ventilation without difficulty and Oral airway inserted - appropriate to patient size Laryngoscope Size: Mac and 4 Grade View: Grade I Tube type: Oral Tube size: 8.0 mm Number of attempts: 1 Airway Equipment and Method: Stylet and Bite block Placement Confirmation: breath sounds checked- equal and bilateral,  ETT inserted through vocal cords under direct vision and positive ETCO2 Secured at: 24 cm Tube secured with: Tape Dental Injury: Teeth and Oropharynx as per pre-operative assessment

## 2015-07-19 NOTE — Interval H&P Note (Signed)
History and Physical Interval Note:  07/19/2015 7:13 AM  Ruben Andrews  has presented today for surgery, with the diagnosis of bilateral hydrocele  The various methods of treatment have been discussed with the patient and family. After consideration of risks, benefits and other options for treatment, the patient has consented to  Procedure(s): HYDROCELECTOMY ADULT BILATERAL  (Bilateral) HERNIA REPAIR INGUINAL ADULT (N/A) INSERTION OF MESH (N/A) as a surgical intervention .  The patient's history has been reviewed, patient examined, no change in status, stable for surgery.  I have reviewed the patient's chart and labs.  Questions were answered to the patient's satisfaction.     Zea Kostka J

## 2015-07-20 ENCOUNTER — Encounter (HOSPITAL_BASED_OUTPATIENT_CLINIC_OR_DEPARTMENT_OTHER): Payer: Self-pay | Admitting: Urology

## 2015-07-20 NOTE — Op Note (Signed)
NAME:  KRISTIJAN, CAYEA NO.:  [id number]  MEDICAL RECORD NO.:  [id number]  LOCATION:                                 FACILITY:  PHYSICIAN:  Marshall Cork. Jeffie Pollock, M.D.    DATE OF BIRTH:  [birth year]  DATE OF PROCEDURE:  07/19/2015 DATE OF DISCHARGE:                              OPERATIVE REPORT   PROCEDURE:  Bilateral hydrocelectomy.  PREOPERATIVE DIAGNOSIS:  Bilateral hydrocele.  POSTOPERATIVE DIAGNOSIS:  Bilateral hydrocele.  SURGEON:  Marshall Cork. Jeffie Pollock, MD  ANESTHESIA:  General.  SPECIMEN:  None.  DRAIN:  Quarter-inch Penrose.  BLOOD LOSS:  Minimal.  COMPLICATIONS:  None.  INDICATIONS:  Mr. Loran is a 60 year old African American male with bilateral hydroceles and a right inguinal hernia.  He is to undergo bilateral hydrocelectomy by me today and a right inguinal hernia repair by Dr. Alvino Blood.  FINDINGS OF PROCEDURE:  He was given Ancef.  He was taken to the operating room where general anesthetic was induced.  He was placed in the supine position.  His lower abdomen and genitalia were clipped.  He was then prepped with Betadine solution and draped in usual sterile fashion.  A midline anterior scrotal incision was made with a knife.  The left testicle within the hydrocele sac was then delivered from the wound using the Bovie and blunt dissection.  Once the sac was freed up to the cord, the sac was opened and drained, approximately 300-400 mL.  The redundant sac was then excised and the residual sac was imbricated behind the testicle in a water bottle fashion using 3-0 chromic in a running locked fashion.  Once the left hydrocele had been resected, the right hydrocele was then delivered in identical fashion.  This hydrocele contained 200-300 mL of fluid.  Residual sac was imbricated behind the testicle in a water bottle fashion with a running locked 3-0 chromic suture.  At this point, the scrotum was inspected for hemostasis.  No active bleeding was  noted.  A cord block was performed with 0.25% Marcaine with 5 mL in each cord.  A quarter-inch Penrose drain was placed through stab wound in the left scrotum in the dependent portion, this was taken through the midline raphe to drain both the right and left scrotal cavities.  At this point, the Dartos was closed using a running 3-0 chromic incorporating the median raphe, but with great care being taken to avoid entrapping the drain.  The skin was then closed with a running vertical mattress 3-0 chromic suture.  Additional 0.25% Marcaine was infiltrated around the incision for a total of 19 mL.  At this point, the drain was left, clipped to the skin with a towel clip and trimmed to an appropriate length.  Dr. Alvino Blood then entered and performed the hernia repair which will be dictated separately.  At the end of the procedure, dressing of 4x4s, fluffs, Kerlix, and scrotal support was applied.  The drain was not secured to the skin.  There were no complications during my portion of procedure.     Marshall Cork. Jeffie Pollock, M.D.     JJW/MEDQ  D:  07/19/2015  T:  07/20/2015  Job:  [id number] 

## 2015-07-22 DIAGNOSIS — Z0000 Encounter for general adult medical examination without abnormal findings: Secondary | ICD-10-CM | POA: Diagnosis not present

## 2015-07-22 DIAGNOSIS — E784 Other hyperlipidemia: Secondary | ICD-10-CM | POA: Diagnosis not present

## 2015-07-22 DIAGNOSIS — Z720 Tobacco use: Secondary | ICD-10-CM | POA: Diagnosis not present

## 2015-07-22 DIAGNOSIS — Z6834 Body mass index (BMI) 34.0-34.9, adult: Secondary | ICD-10-CM | POA: Diagnosis not present

## 2015-07-22 DIAGNOSIS — J449 Chronic obstructive pulmonary disease, unspecified: Secondary | ICD-10-CM | POA: Diagnosis not present

## 2015-07-22 DIAGNOSIS — K4090 Unilateral inguinal hernia, without obstruction or gangrene, not specified as recurrent: Secondary | ICD-10-CM | POA: Diagnosis not present

## 2015-07-22 DIAGNOSIS — N433 Hydrocele, unspecified: Secondary | ICD-10-CM | POA: Diagnosis not present

## 2015-07-22 DIAGNOSIS — Z23 Encounter for immunization: Secondary | ICD-10-CM | POA: Diagnosis not present

## 2015-07-22 DIAGNOSIS — Z1389 Encounter for screening for other disorder: Secondary | ICD-10-CM | POA: Diagnosis not present

## 2015-07-22 DIAGNOSIS — K219 Gastro-esophageal reflux disease without esophagitis: Secondary | ICD-10-CM | POA: Diagnosis not present

## 2015-07-22 DIAGNOSIS — I10 Essential (primary) hypertension: Secondary | ICD-10-CM | POA: Diagnosis not present

## 2015-07-22 DIAGNOSIS — E119 Type 2 diabetes mellitus without complications: Secondary | ICD-10-CM | POA: Diagnosis not present

## 2015-09-07 DIAGNOSIS — M25552 Pain in left hip: Secondary | ICD-10-CM | POA: Diagnosis not present

## 2015-09-07 DIAGNOSIS — Z96642 Presence of left artificial hip joint: Secondary | ICD-10-CM | POA: Diagnosis not present

## 2015-09-07 DIAGNOSIS — Z96641 Presence of right artificial hip joint: Secondary | ICD-10-CM | POA: Diagnosis not present

## 2015-10-03 ENCOUNTER — Other Ambulatory Visit: Payer: Self-pay | Admitting: Urology

## 2015-10-03 DIAGNOSIS — C61 Malignant neoplasm of prostate: Secondary | ICD-10-CM

## 2015-11-16 DIAGNOSIS — Z96642 Presence of left artificial hip joint: Secondary | ICD-10-CM | POA: Diagnosis not present

## 2015-11-16 DIAGNOSIS — Z96649 Presence of unspecified artificial hip joint: Secondary | ICD-10-CM | POA: Diagnosis not present

## 2015-11-16 DIAGNOSIS — Z96643 Presence of artificial hip joint, bilateral: Secondary | ICD-10-CM | POA: Diagnosis not present

## 2015-11-16 DIAGNOSIS — T84018A Broken internal joint prosthesis, other site, initial encounter: Secondary | ICD-10-CM | POA: Diagnosis not present

## 2015-11-16 DIAGNOSIS — M1612 Unilateral primary osteoarthritis, left hip: Secondary | ICD-10-CM | POA: Diagnosis not present

## 2015-11-21 DIAGNOSIS — M160 Bilateral primary osteoarthritis of hip: Secondary | ICD-10-CM | POA: Diagnosis not present

## 2015-11-21 DIAGNOSIS — I10 Essential (primary) hypertension: Secondary | ICD-10-CM | POA: Diagnosis not present

## 2015-11-21 DIAGNOSIS — Z6834 Body mass index (BMI) 34.0-34.9, adult: Secondary | ICD-10-CM | POA: Diagnosis not present

## 2015-11-21 DIAGNOSIS — E119 Type 2 diabetes mellitus without complications: Secondary | ICD-10-CM | POA: Diagnosis not present

## 2015-11-21 DIAGNOSIS — E784 Other hyperlipidemia: Secondary | ICD-10-CM | POA: Diagnosis not present

## 2015-12-26 DIAGNOSIS — C61 Malignant neoplasm of prostate: Secondary | ICD-10-CM | POA: Diagnosis not present

## 2015-12-29 ENCOUNTER — Encounter (HOSPITAL_COMMUNITY)
Admission: RE | Admit: 2015-12-29 | Discharge: 2015-12-29 | Disposition: A | Discharge status: Home / Self Care | Payer: Self-pay | Source: Ambulatory Visit | Attending: Urology | Admitting: Urology

## 2015-12-29 DIAGNOSIS — C61 Malignant neoplasm of prostate: Secondary | ICD-10-CM

## 2015-12-29 MED ORDER — TECHNETIUM TC 99M MEDRONATE IV KIT
20.1000 | PACK | Freq: Once | INTRAVENOUS | Status: AC | PRN
  Administered 2015-12-29: 20.1 via INTRAVENOUS

## 2016-01-02 DIAGNOSIS — T84018A Broken internal joint prosthesis, other site, initial encounter: Secondary | ICD-10-CM | POA: Diagnosis not present

## 2016-01-02 DIAGNOSIS — M25552 Pain in left hip: Secondary | ICD-10-CM | POA: Diagnosis not present

## 2016-01-02 DIAGNOSIS — Z96649 Presence of unspecified artificial hip joint: Secondary | ICD-10-CM | POA: Diagnosis not present

## 2016-01-28 DIAGNOSIS — Z23 Encounter for immunization: Secondary | ICD-10-CM | POA: Diagnosis not present

## 2016-02-13 DIAGNOSIS — J069 Acute upper respiratory infection, unspecified: Secondary | ICD-10-CM | POA: Diagnosis not present

## 2016-02-13 DIAGNOSIS — J449 Chronic obstructive pulmonary disease, unspecified: Secondary | ICD-10-CM | POA: Diagnosis not present

## 2016-02-13 DIAGNOSIS — Z6834 Body mass index (BMI) 34.0-34.9, adult: Secondary | ICD-10-CM | POA: Diagnosis not present

## 2016-02-14 DIAGNOSIS — M25552 Pain in left hip: Secondary | ICD-10-CM | POA: Diagnosis not present

## 2016-02-17 DIAGNOSIS — Z6833 Body mass index (BMI) 33.0-33.9, adult: Secondary | ICD-10-CM | POA: Diagnosis not present

## 2016-02-17 DIAGNOSIS — R51 Headache: Secondary | ICD-10-CM | POA: Diagnosis not present

## 2016-03-14 DIAGNOSIS — Z01818 Encounter for other preprocedural examination: Secondary | ICD-10-CM | POA: Diagnosis not present

## 2016-03-14 DIAGNOSIS — Z96642 Presence of left artificial hip joint: Secondary | ICD-10-CM | POA: Diagnosis not present

## 2016-03-14 DIAGNOSIS — Z01810 Encounter for preprocedural cardiovascular examination: Secondary | ICD-10-CM | POA: Diagnosis not present

## 2016-03-14 DIAGNOSIS — T849XXA Unspecified complication of internal orthopedic prosthetic device, implant and graft, initial encounter: Secondary | ICD-10-CM | POA: Diagnosis not present

## 2016-03-27 DIAGNOSIS — Z471 Aftercare following joint replacement surgery: Secondary | ICD-10-CM | POA: Diagnosis not present

## 2016-03-27 DIAGNOSIS — T84011D Broken internal left hip prosthesis, subsequent encounter: Secondary | ICD-10-CM | POA: Diagnosis not present

## 2016-03-27 DIAGNOSIS — T84019D Broken internal joint prosthesis, unspecified site, subsequent encounter: Secondary | ICD-10-CM | POA: Diagnosis not present

## 2016-03-27 DIAGNOSIS — Z96641 Presence of right artificial hip joint: Secondary | ICD-10-CM | POA: Diagnosis not present

## 2016-03-27 DIAGNOSIS — Z96642 Presence of left artificial hip joint: Secondary | ICD-10-CM | POA: Diagnosis not present

## 2016-03-27 DIAGNOSIS — I10 Essential (primary) hypertension: Secondary | ICD-10-CM | POA: Diagnosis present

## 2016-03-27 DIAGNOSIS — M25552 Pain in left hip: Secondary | ICD-10-CM | POA: Diagnosis not present

## 2016-03-27 DIAGNOSIS — M1990 Unspecified osteoarthritis, unspecified site: Secondary | ICD-10-CM | POA: Diagnosis present

## 2016-03-27 DIAGNOSIS — T84018D Broken internal joint prosthesis, other site, subsequent encounter: Secondary | ICD-10-CM | POA: Diagnosis not present

## 2016-03-27 DIAGNOSIS — F17210 Nicotine dependence, cigarettes, uncomplicated: Secondary | ICD-10-CM | POA: Diagnosis present

## 2016-03-27 DIAGNOSIS — G8918 Other acute postprocedural pain: Secondary | ICD-10-CM | POA: Diagnosis not present

## 2016-03-27 DIAGNOSIS — Z8546 Personal history of malignant neoplasm of prostate: Secondary | ICD-10-CM | POA: Diagnosis not present

## 2016-03-27 DIAGNOSIS — T84031A Mechanical loosening of internal left hip prosthetic joint, initial encounter: Secondary | ICD-10-CM | POA: Diagnosis present

## 2016-03-27 DIAGNOSIS — Z96649 Presence of unspecified artificial hip joint: Secondary | ICD-10-CM | POA: Diagnosis not present

## 2016-03-31 DIAGNOSIS — T84091D Other mechanical complication of internal left hip prosthesis, subsequent encounter: Secondary | ICD-10-CM | POA: Diagnosis not present

## 2016-03-31 DIAGNOSIS — I10 Essential (primary) hypertension: Secondary | ICD-10-CM | POA: Diagnosis not present

## 2016-03-31 DIAGNOSIS — F419 Anxiety disorder, unspecified: Secondary | ICD-10-CM | POA: Diagnosis not present

## 2016-03-31 DIAGNOSIS — F17210 Nicotine dependence, cigarettes, uncomplicated: Secondary | ICD-10-CM | POA: Diagnosis not present

## 2016-03-31 DIAGNOSIS — K219 Gastro-esophageal reflux disease without esophagitis: Secondary | ICD-10-CM | POA: Diagnosis not present

## 2016-03-31 DIAGNOSIS — Z8546 Personal history of malignant neoplasm of prostate: Secondary | ICD-10-CM | POA: Diagnosis not present

## 2016-03-31 DIAGNOSIS — M1991 Primary osteoarthritis, unspecified site: Secondary | ICD-10-CM | POA: Diagnosis not present

## 2016-03-31 DIAGNOSIS — Z7982 Long term (current) use of aspirin: Secondary | ICD-10-CM | POA: Diagnosis not present

## 2016-04-02 DIAGNOSIS — F17210 Nicotine dependence, cigarettes, uncomplicated: Secondary | ICD-10-CM | POA: Diagnosis not present

## 2016-04-02 DIAGNOSIS — F419 Anxiety disorder, unspecified: Secondary | ICD-10-CM | POA: Diagnosis not present

## 2016-04-02 DIAGNOSIS — M1991 Primary osteoarthritis, unspecified site: Secondary | ICD-10-CM | POA: Diagnosis not present

## 2016-04-02 DIAGNOSIS — T84091D Other mechanical complication of internal left hip prosthesis, subsequent encounter: Secondary | ICD-10-CM | POA: Diagnosis not present

## 2016-04-02 DIAGNOSIS — K219 Gastro-esophageal reflux disease without esophagitis: Secondary | ICD-10-CM | POA: Diagnosis not present

## 2016-04-02 DIAGNOSIS — I10 Essential (primary) hypertension: Secondary | ICD-10-CM | POA: Diagnosis not present

## 2016-04-03 ENCOUNTER — Other Ambulatory Visit: Payer: Self-pay | Admitting: Neurology

## 2016-04-03 DIAGNOSIS — F17210 Nicotine dependence, cigarettes, uncomplicated: Secondary | ICD-10-CM | POA: Diagnosis not present

## 2016-04-03 DIAGNOSIS — T84091D Other mechanical complication of internal left hip prosthesis, subsequent encounter: Secondary | ICD-10-CM | POA: Diagnosis not present

## 2016-04-03 DIAGNOSIS — F419 Anxiety disorder, unspecified: Secondary | ICD-10-CM | POA: Diagnosis not present

## 2016-04-03 DIAGNOSIS — K219 Gastro-esophageal reflux disease without esophagitis: Secondary | ICD-10-CM | POA: Diagnosis not present

## 2016-04-03 DIAGNOSIS — I10 Essential (primary) hypertension: Secondary | ICD-10-CM | POA: Diagnosis not present

## 2016-04-03 DIAGNOSIS — M1991 Primary osteoarthritis, unspecified site: Secondary | ICD-10-CM | POA: Diagnosis not present

## 2016-04-04 ENCOUNTER — Other Ambulatory Visit: Payer: Self-pay | Admitting: Urology

## 2016-04-04 DIAGNOSIS — T84091D Other mechanical complication of internal left hip prosthesis, subsequent encounter: Secondary | ICD-10-CM | POA: Diagnosis not present

## 2016-04-04 DIAGNOSIS — M1991 Primary osteoarthritis, unspecified site: Secondary | ICD-10-CM | POA: Diagnosis not present

## 2016-04-04 DIAGNOSIS — K219 Gastro-esophageal reflux disease without esophagitis: Secondary | ICD-10-CM | POA: Diagnosis not present

## 2016-04-04 DIAGNOSIS — F419 Anxiety disorder, unspecified: Secondary | ICD-10-CM | POA: Diagnosis not present

## 2016-04-04 DIAGNOSIS — F17210 Nicotine dependence, cigarettes, uncomplicated: Secondary | ICD-10-CM | POA: Diagnosis not present

## 2016-04-04 DIAGNOSIS — C61 Malignant neoplasm of prostate: Secondary | ICD-10-CM

## 2016-04-04 DIAGNOSIS — I10 Essential (primary) hypertension: Secondary | ICD-10-CM | POA: Diagnosis not present

## 2016-04-06 DIAGNOSIS — F419 Anxiety disorder, unspecified: Secondary | ICD-10-CM | POA: Diagnosis not present

## 2016-04-06 DIAGNOSIS — T84091D Other mechanical complication of internal left hip prosthesis, subsequent encounter: Secondary | ICD-10-CM | POA: Diagnosis not present

## 2016-04-06 DIAGNOSIS — K219 Gastro-esophageal reflux disease without esophagitis: Secondary | ICD-10-CM | POA: Diagnosis not present

## 2016-04-06 DIAGNOSIS — F17210 Nicotine dependence, cigarettes, uncomplicated: Secondary | ICD-10-CM | POA: Diagnosis not present

## 2016-04-06 DIAGNOSIS — I10 Essential (primary) hypertension: Secondary | ICD-10-CM | POA: Diagnosis not present

## 2016-04-06 DIAGNOSIS — M1991 Primary osteoarthritis, unspecified site: Secondary | ICD-10-CM | POA: Diagnosis not present

## 2016-04-10 DIAGNOSIS — M1990 Unspecified osteoarthritis, unspecified site: Secondary | ICD-10-CM | POA: Diagnosis present

## 2016-04-10 DIAGNOSIS — M1991 Primary osteoarthritis, unspecified site: Secondary | ICD-10-CM | POA: Diagnosis not present

## 2016-04-10 DIAGNOSIS — Z471 Aftercare following joint replacement surgery: Secondary | ICD-10-CM | POA: Diagnosis not present

## 2016-04-10 DIAGNOSIS — F419 Anxiety disorder, unspecified: Secondary | ICD-10-CM | POA: Diagnosis present

## 2016-04-10 DIAGNOSIS — M0080 Arthritis due to other bacteria, unspecified joint: Secondary | ICD-10-CM | POA: Diagnosis present

## 2016-04-10 DIAGNOSIS — Z8546 Personal history of malignant neoplasm of prostate: Secondary | ICD-10-CM | POA: Diagnosis not present

## 2016-04-10 DIAGNOSIS — B9689 Other specified bacterial agents as the cause of diseases classified elsewhere: Secondary | ICD-10-CM | POA: Diagnosis not present

## 2016-04-10 DIAGNOSIS — K219 Gastro-esophageal reflux disease without esophagitis: Secondary | ICD-10-CM | POA: Diagnosis not present

## 2016-04-10 DIAGNOSIS — Z452 Encounter for adjustment and management of vascular access device: Secondary | ICD-10-CM | POA: Diagnosis not present

## 2016-04-10 DIAGNOSIS — Z96643 Presence of artificial hip joint, bilateral: Secondary | ICD-10-CM | POA: Diagnosis present

## 2016-04-10 DIAGNOSIS — F17210 Nicotine dependence, cigarettes, uncomplicated: Secondary | ICD-10-CM | POA: Diagnosis present

## 2016-04-10 DIAGNOSIS — Z96642 Presence of left artificial hip joint: Secondary | ICD-10-CM | POA: Diagnosis not present

## 2016-04-10 DIAGNOSIS — I10 Essential (primary) hypertension: Secondary | ICD-10-CM | POA: Diagnosis present

## 2016-04-10 DIAGNOSIS — T84091D Other mechanical complication of internal left hip prosthesis, subsequent encounter: Secondary | ICD-10-CM | POA: Diagnosis not present

## 2016-04-13 DIAGNOSIS — K219 Gastro-esophageal reflux disease without esophagitis: Secondary | ICD-10-CM | POA: Diagnosis not present

## 2016-04-13 DIAGNOSIS — F17210 Nicotine dependence, cigarettes, uncomplicated: Secondary | ICD-10-CM | POA: Diagnosis not present

## 2016-04-13 DIAGNOSIS — I10 Essential (primary) hypertension: Secondary | ICD-10-CM | POA: Diagnosis not present

## 2016-04-13 DIAGNOSIS — T84091D Other mechanical complication of internal left hip prosthesis, subsequent encounter: Secondary | ICD-10-CM | POA: Diagnosis not present

## 2016-04-13 DIAGNOSIS — F419 Anxiety disorder, unspecified: Secondary | ICD-10-CM | POA: Diagnosis not present

## 2016-04-13 DIAGNOSIS — M1991 Primary osteoarthritis, unspecified site: Secondary | ICD-10-CM | POA: Diagnosis not present

## 2016-04-15 DIAGNOSIS — F419 Anxiety disorder, unspecified: Secondary | ICD-10-CM | POA: Diagnosis not present

## 2016-04-15 DIAGNOSIS — T84091D Other mechanical complication of internal left hip prosthesis, subsequent encounter: Secondary | ICD-10-CM | POA: Diagnosis not present

## 2016-04-15 DIAGNOSIS — I10 Essential (primary) hypertension: Secondary | ICD-10-CM | POA: Diagnosis not present

## 2016-04-15 DIAGNOSIS — K219 Gastro-esophageal reflux disease without esophagitis: Secondary | ICD-10-CM | POA: Diagnosis not present

## 2016-04-15 DIAGNOSIS — F17210 Nicotine dependence, cigarettes, uncomplicated: Secondary | ICD-10-CM | POA: Diagnosis not present

## 2016-04-15 DIAGNOSIS — M1991 Primary osteoarthritis, unspecified site: Secondary | ICD-10-CM | POA: Diagnosis not present

## 2016-04-16 DIAGNOSIS — I10 Essential (primary) hypertension: Secondary | ICD-10-CM | POA: Diagnosis not present

## 2016-04-16 DIAGNOSIS — M1612 Unilateral primary osteoarthritis, left hip: Secondary | ICD-10-CM | POA: Diagnosis not present

## 2016-04-16 DIAGNOSIS — K219 Gastro-esophageal reflux disease without esophagitis: Secondary | ICD-10-CM | POA: Diagnosis not present

## 2016-04-16 DIAGNOSIS — M1991 Primary osteoarthritis, unspecified site: Secondary | ICD-10-CM | POA: Diagnosis not present

## 2016-04-16 DIAGNOSIS — F419 Anxiety disorder, unspecified: Secondary | ICD-10-CM | POA: Diagnosis not present

## 2016-04-16 DIAGNOSIS — F17210 Nicotine dependence, cigarettes, uncomplicated: Secondary | ICD-10-CM | POA: Diagnosis not present

## 2016-04-16 DIAGNOSIS — Z96642 Presence of left artificial hip joint: Secondary | ICD-10-CM | POA: Diagnosis not present

## 2016-04-16 DIAGNOSIS — Z7982 Long term (current) use of aspirin: Secondary | ICD-10-CM | POA: Diagnosis not present

## 2016-04-16 DIAGNOSIS — T84091D Other mechanical complication of internal left hip prosthesis, subsequent encounter: Secondary | ICD-10-CM | POA: Diagnosis not present

## 2016-04-16 HISTORY — PX: SUPRA-UMBILICAL HERNIA: SHX6105

## 2016-04-18 DIAGNOSIS — T84091D Other mechanical complication of internal left hip prosthesis, subsequent encounter: Secondary | ICD-10-CM | POA: Diagnosis not present

## 2016-04-18 DIAGNOSIS — K219 Gastro-esophageal reflux disease without esophagitis: Secondary | ICD-10-CM | POA: Diagnosis not present

## 2016-04-18 DIAGNOSIS — F17210 Nicotine dependence, cigarettes, uncomplicated: Secondary | ICD-10-CM | POA: Diagnosis not present

## 2016-04-18 DIAGNOSIS — F419 Anxiety disorder, unspecified: Secondary | ICD-10-CM | POA: Diagnosis not present

## 2016-04-18 DIAGNOSIS — M1991 Primary osteoarthritis, unspecified site: Secondary | ICD-10-CM | POA: Diagnosis not present

## 2016-04-18 DIAGNOSIS — I10 Essential (primary) hypertension: Secondary | ICD-10-CM | POA: Diagnosis not present

## 2016-04-23 DIAGNOSIS — M1991 Primary osteoarthritis, unspecified site: Secondary | ICD-10-CM | POA: Diagnosis not present

## 2016-04-23 DIAGNOSIS — F419 Anxiety disorder, unspecified: Secondary | ICD-10-CM | POA: Diagnosis not present

## 2016-04-23 DIAGNOSIS — F17210 Nicotine dependence, cigarettes, uncomplicated: Secondary | ICD-10-CM | POA: Diagnosis not present

## 2016-04-23 DIAGNOSIS — K219 Gastro-esophageal reflux disease without esophagitis: Secondary | ICD-10-CM | POA: Diagnosis not present

## 2016-04-23 DIAGNOSIS — T84091D Other mechanical complication of internal left hip prosthesis, subsequent encounter: Secondary | ICD-10-CM | POA: Diagnosis not present

## 2016-04-23 DIAGNOSIS — I10 Essential (primary) hypertension: Secondary | ICD-10-CM | POA: Diagnosis not present

## 2016-04-25 DIAGNOSIS — F17210 Nicotine dependence, cigarettes, uncomplicated: Secondary | ICD-10-CM | POA: Diagnosis not present

## 2016-04-25 DIAGNOSIS — T84091D Other mechanical complication of internal left hip prosthesis, subsequent encounter: Secondary | ICD-10-CM | POA: Diagnosis not present

## 2016-04-25 DIAGNOSIS — F419 Anxiety disorder, unspecified: Secondary | ICD-10-CM | POA: Diagnosis not present

## 2016-04-25 DIAGNOSIS — K219 Gastro-esophageal reflux disease without esophagitis: Secondary | ICD-10-CM | POA: Diagnosis not present

## 2016-04-25 DIAGNOSIS — I10 Essential (primary) hypertension: Secondary | ICD-10-CM | POA: Diagnosis not present

## 2016-04-25 DIAGNOSIS — M1991 Primary osteoarthritis, unspecified site: Secondary | ICD-10-CM | POA: Diagnosis not present

## 2016-04-30 DIAGNOSIS — F17210 Nicotine dependence, cigarettes, uncomplicated: Secondary | ICD-10-CM | POA: Diagnosis not present

## 2016-04-30 DIAGNOSIS — M1991 Primary osteoarthritis, unspecified site: Secondary | ICD-10-CM | POA: Diagnosis not present

## 2016-04-30 DIAGNOSIS — T84091D Other mechanical complication of internal left hip prosthesis, subsequent encounter: Secondary | ICD-10-CM | POA: Diagnosis not present

## 2016-04-30 DIAGNOSIS — I10 Essential (primary) hypertension: Secondary | ICD-10-CM | POA: Diagnosis not present

## 2016-04-30 DIAGNOSIS — K219 Gastro-esophageal reflux disease without esophagitis: Secondary | ICD-10-CM | POA: Diagnosis not present

## 2016-04-30 DIAGNOSIS — F419 Anxiety disorder, unspecified: Secondary | ICD-10-CM | POA: Diagnosis not present

## 2016-04-30 DIAGNOSIS — Z7982 Long term (current) use of aspirin: Secondary | ICD-10-CM | POA: Diagnosis not present

## 2016-05-04 DIAGNOSIS — M1991 Primary osteoarthritis, unspecified site: Secondary | ICD-10-CM | POA: Diagnosis not present

## 2016-05-04 DIAGNOSIS — T84091D Other mechanical complication of internal left hip prosthesis, subsequent encounter: Secondary | ICD-10-CM | POA: Diagnosis not present

## 2016-05-04 DIAGNOSIS — K219 Gastro-esophageal reflux disease without esophagitis: Secondary | ICD-10-CM | POA: Diagnosis not present

## 2016-05-04 DIAGNOSIS — F419 Anxiety disorder, unspecified: Secondary | ICD-10-CM | POA: Diagnosis not present

## 2016-05-04 DIAGNOSIS — F17210 Nicotine dependence, cigarettes, uncomplicated: Secondary | ICD-10-CM | POA: Diagnosis not present

## 2016-05-04 DIAGNOSIS — I10 Essential (primary) hypertension: Secondary | ICD-10-CM | POA: Diagnosis not present

## 2016-05-05 DIAGNOSIS — M1991 Primary osteoarthritis, unspecified site: Secondary | ICD-10-CM | POA: Diagnosis not present

## 2016-05-05 DIAGNOSIS — T84091D Other mechanical complication of internal left hip prosthesis, subsequent encounter: Secondary | ICD-10-CM | POA: Diagnosis not present

## 2016-05-05 DIAGNOSIS — I10 Essential (primary) hypertension: Secondary | ICD-10-CM | POA: Diagnosis not present

## 2016-05-05 DIAGNOSIS — K219 Gastro-esophageal reflux disease without esophagitis: Secondary | ICD-10-CM | POA: Diagnosis not present

## 2016-05-05 DIAGNOSIS — F419 Anxiety disorder, unspecified: Secondary | ICD-10-CM | POA: Diagnosis not present

## 2016-05-05 DIAGNOSIS — F17210 Nicotine dependence, cigarettes, uncomplicated: Secondary | ICD-10-CM | POA: Diagnosis not present

## 2016-05-07 DIAGNOSIS — F17210 Nicotine dependence, cigarettes, uncomplicated: Secondary | ICD-10-CM | POA: Diagnosis not present

## 2016-05-07 DIAGNOSIS — F419 Anxiety disorder, unspecified: Secondary | ICD-10-CM | POA: Diagnosis not present

## 2016-05-07 DIAGNOSIS — M1991 Primary osteoarthritis, unspecified site: Secondary | ICD-10-CM | POA: Diagnosis not present

## 2016-05-07 DIAGNOSIS — I10 Essential (primary) hypertension: Secondary | ICD-10-CM | POA: Diagnosis not present

## 2016-05-07 DIAGNOSIS — T84091D Other mechanical complication of internal left hip prosthesis, subsequent encounter: Secondary | ICD-10-CM | POA: Diagnosis not present

## 2016-05-07 DIAGNOSIS — K219 Gastro-esophageal reflux disease without esophagitis: Secondary | ICD-10-CM | POA: Diagnosis not present

## 2016-05-07 DIAGNOSIS — I110 Hypertensive heart disease with heart failure: Secondary | ICD-10-CM | POA: Diagnosis not present

## 2016-05-09 DIAGNOSIS — M1991 Primary osteoarthritis, unspecified site: Secondary | ICD-10-CM | POA: Diagnosis not present

## 2016-05-09 DIAGNOSIS — F17210 Nicotine dependence, cigarettes, uncomplicated: Secondary | ICD-10-CM | POA: Diagnosis not present

## 2016-05-09 DIAGNOSIS — T84091D Other mechanical complication of internal left hip prosthesis, subsequent encounter: Secondary | ICD-10-CM | POA: Diagnosis not present

## 2016-05-09 DIAGNOSIS — K219 Gastro-esophageal reflux disease without esophagitis: Secondary | ICD-10-CM | POA: Diagnosis not present

## 2016-05-09 DIAGNOSIS — I10 Essential (primary) hypertension: Secondary | ICD-10-CM | POA: Diagnosis not present

## 2016-05-09 DIAGNOSIS — F419 Anxiety disorder, unspecified: Secondary | ICD-10-CM | POA: Diagnosis not present

## 2016-05-12 DIAGNOSIS — M1612 Unilateral primary osteoarthritis, left hip: Secondary | ICD-10-CM | POA: Diagnosis not present

## 2016-05-12 DIAGNOSIS — Z6835 Body mass index (BMI) 35.0-35.9, adult: Secondary | ICD-10-CM | POA: Diagnosis not present

## 2016-05-12 DIAGNOSIS — F419 Anxiety disorder, unspecified: Secondary | ICD-10-CM | POA: Diagnosis not present

## 2016-05-12 DIAGNOSIS — R899 Unspecified abnormal finding in specimens from other organs, systems and tissues: Secondary | ICD-10-CM | POA: Diagnosis not present

## 2016-05-15 DIAGNOSIS — K219 Gastro-esophageal reflux disease without esophagitis: Secondary | ICD-10-CM | POA: Diagnosis not present

## 2016-05-15 DIAGNOSIS — T84091D Other mechanical complication of internal left hip prosthesis, subsequent encounter: Secondary | ICD-10-CM | POA: Diagnosis not present

## 2016-05-15 DIAGNOSIS — M1991 Primary osteoarthritis, unspecified site: Secondary | ICD-10-CM | POA: Diagnosis not present

## 2016-05-15 DIAGNOSIS — F419 Anxiety disorder, unspecified: Secondary | ICD-10-CM | POA: Diagnosis not present

## 2016-05-15 DIAGNOSIS — F17210 Nicotine dependence, cigarettes, uncomplicated: Secondary | ICD-10-CM | POA: Diagnosis not present

## 2016-05-15 DIAGNOSIS — I10 Essential (primary) hypertension: Secondary | ICD-10-CM | POA: Diagnosis not present

## 2016-05-17 DIAGNOSIS — M1991 Primary osteoarthritis, unspecified site: Secondary | ICD-10-CM | POA: Diagnosis not present

## 2016-05-17 DIAGNOSIS — F17210 Nicotine dependence, cigarettes, uncomplicated: Secondary | ICD-10-CM | POA: Diagnosis not present

## 2016-05-17 DIAGNOSIS — T84091D Other mechanical complication of internal left hip prosthesis, subsequent encounter: Secondary | ICD-10-CM | POA: Diagnosis not present

## 2016-05-17 DIAGNOSIS — I10 Essential (primary) hypertension: Secondary | ICD-10-CM | POA: Diagnosis not present

## 2016-05-17 DIAGNOSIS — K219 Gastro-esophageal reflux disease without esophagitis: Secondary | ICD-10-CM | POA: Diagnosis not present

## 2016-05-17 DIAGNOSIS — F419 Anxiety disorder, unspecified: Secondary | ICD-10-CM | POA: Diagnosis not present

## 2016-05-18 DIAGNOSIS — F17210 Nicotine dependence, cigarettes, uncomplicated: Secondary | ICD-10-CM | POA: Diagnosis not present

## 2016-05-18 DIAGNOSIS — I10 Essential (primary) hypertension: Secondary | ICD-10-CM | POA: Diagnosis not present

## 2016-05-18 DIAGNOSIS — M1991 Primary osteoarthritis, unspecified site: Secondary | ICD-10-CM | POA: Diagnosis not present

## 2016-05-18 DIAGNOSIS — K219 Gastro-esophageal reflux disease without esophagitis: Secondary | ICD-10-CM | POA: Diagnosis not present

## 2016-05-18 DIAGNOSIS — F419 Anxiety disorder, unspecified: Secondary | ICD-10-CM | POA: Diagnosis not present

## 2016-05-18 DIAGNOSIS — R7881 Bacteremia: Secondary | ICD-10-CM | POA: Diagnosis not present

## 2016-05-18 DIAGNOSIS — T84091D Other mechanical complication of internal left hip prosthesis, subsequent encounter: Secondary | ICD-10-CM | POA: Diagnosis not present

## 2016-05-21 DIAGNOSIS — Z96649 Presence of unspecified artificial hip joint: Secondary | ICD-10-CM | POA: Diagnosis not present

## 2016-05-21 DIAGNOSIS — Z96642 Presence of left artificial hip joint: Secondary | ICD-10-CM | POA: Diagnosis not present

## 2016-05-21 DIAGNOSIS — Z471 Aftercare following joint replacement surgery: Secondary | ICD-10-CM | POA: Diagnosis not present

## 2016-05-21 DIAGNOSIS — T84018D Broken internal joint prosthesis, other site, subsequent encounter: Secondary | ICD-10-CM | POA: Diagnosis not present

## 2016-05-21 DIAGNOSIS — T8459XD Infection and inflammatory reaction due to other internal joint prosthesis, subsequent encounter: Secondary | ICD-10-CM | POA: Diagnosis not present

## 2016-06-07 DIAGNOSIS — C61 Malignant neoplasm of prostate: Secondary | ICD-10-CM | POA: Diagnosis not present

## 2016-06-14 ENCOUNTER — Encounter (HOSPITAL_COMMUNITY)
Admission: RE | Admit: 2016-06-14 | Discharge: 2016-06-14 | Disposition: A | Discharge status: Home / Self Care | Payer: Medicare Other | Source: Ambulatory Visit | Attending: Urology | Admitting: Urology

## 2016-06-14 DIAGNOSIS — C61 Malignant neoplasm of prostate: Secondary | ICD-10-CM | POA: Insufficient documentation

## 2016-06-14 MED ORDER — TECHNETIUM TC 99M MEDRONATE IV KIT
21.3000 | PACK | Freq: Once | INTRAVENOUS | Status: AC | PRN
  Administered 2016-06-14: 21.3 via INTRAVENOUS

## 2016-07-09 DIAGNOSIS — Z96642 Presence of left artificial hip joint: Secondary | ICD-10-CM | POA: Diagnosis not present

## 2016-07-09 DIAGNOSIS — Z471 Aftercare following joint replacement surgery: Secondary | ICD-10-CM | POA: Diagnosis not present

## 2016-07-09 DIAGNOSIS — Z96649 Presence of unspecified artificial hip joint: Secondary | ICD-10-CM | POA: Diagnosis not present

## 2016-07-24 DIAGNOSIS — E784 Other hyperlipidemia: Secondary | ICD-10-CM | POA: Diagnosis not present

## 2016-07-24 DIAGNOSIS — I10 Essential (primary) hypertension: Secondary | ICD-10-CM | POA: Diagnosis not present

## 2016-07-24 DIAGNOSIS — Z125 Encounter for screening for malignant neoplasm of prostate: Secondary | ICD-10-CM | POA: Diagnosis not present

## 2016-07-24 DIAGNOSIS — E119 Type 2 diabetes mellitus without complications: Secondary | ICD-10-CM | POA: Diagnosis not present

## 2016-07-30 DIAGNOSIS — N5089 Other specified disorders of the male genital organs: Secondary | ICD-10-CM | POA: Diagnosis not present

## 2016-07-30 DIAGNOSIS — Z1212 Encounter for screening for malignant neoplasm of rectum: Secondary | ICD-10-CM | POA: Diagnosis not present

## 2016-07-30 DIAGNOSIS — E119 Type 2 diabetes mellitus without complications: Secondary | ICD-10-CM | POA: Diagnosis not present

## 2016-07-30 DIAGNOSIS — K921 Melena: Secondary | ICD-10-CM | POA: Diagnosis not present

## 2016-07-30 DIAGNOSIS — Z0000 Encounter for general adult medical examination without abnormal findings: Secondary | ICD-10-CM | POA: Diagnosis not present

## 2016-07-30 DIAGNOSIS — Z1389 Encounter for screening for other disorder: Secondary | ICD-10-CM | POA: Diagnosis not present

## 2016-07-30 DIAGNOSIS — E784 Other hyperlipidemia: Secondary | ICD-10-CM | POA: Diagnosis not present

## 2016-07-30 DIAGNOSIS — F418 Other specified anxiety disorders: Secondary | ICD-10-CM | POA: Diagnosis not present

## 2016-07-30 DIAGNOSIS — Z6836 Body mass index (BMI) 36.0-36.9, adult: Secondary | ICD-10-CM | POA: Diagnosis not present

## 2016-07-30 DIAGNOSIS — I10 Essential (primary) hypertension: Secondary | ICD-10-CM | POA: Diagnosis not present

## 2016-07-30 DIAGNOSIS — R1903 Right lower quadrant abdominal swelling, mass and lump: Secondary | ICD-10-CM | POA: Diagnosis not present

## 2016-07-30 DIAGNOSIS — J449 Chronic obstructive pulmonary disease, unspecified: Secondary | ICD-10-CM | POA: Diagnosis not present

## 2016-07-30 DIAGNOSIS — Z8546 Personal history of malignant neoplasm of prostate: Secondary | ICD-10-CM | POA: Diagnosis not present

## 2016-08-13 ENCOUNTER — Other Ambulatory Visit: Payer: Self-pay | Admitting: Urology

## 2016-08-13 DIAGNOSIS — C61 Malignant neoplasm of prostate: Secondary | ICD-10-CM

## 2016-09-03 DIAGNOSIS — R895 Abnormal microbiological findings in specimens from other organs, systems and tissues: Secondary | ICD-10-CM | POA: Diagnosis not present

## 2016-09-03 DIAGNOSIS — T84018D Broken internal joint prosthesis, other site, subsequent encounter: Secondary | ICD-10-CM | POA: Diagnosis not present

## 2016-09-03 DIAGNOSIS — Z96642 Presence of left artificial hip joint: Secondary | ICD-10-CM | POA: Diagnosis not present

## 2016-09-20 DIAGNOSIS — Z6837 Body mass index (BMI) 37.0-37.9, adult: Secondary | ICD-10-CM | POA: Diagnosis not present

## 2016-09-20 DIAGNOSIS — L259 Unspecified contact dermatitis, unspecified cause: Secondary | ICD-10-CM | POA: Diagnosis not present

## 2016-09-20 DIAGNOSIS — Z1389 Encounter for screening for other disorder: Secondary | ICD-10-CM | POA: Diagnosis not present

## 2016-09-20 DIAGNOSIS — F5221 Male erectile disorder: Secondary | ICD-10-CM | POA: Diagnosis not present

## 2016-09-20 DIAGNOSIS — F419 Anxiety disorder, unspecified: Secondary | ICD-10-CM | POA: Diagnosis not present

## 2016-11-28 ENCOUNTER — Encounter (HOSPITAL_COMMUNITY)
Admission: RE | Admit: 2016-11-28 | Discharge: 2016-11-28 | Disposition: A | Discharge status: Home / Self Care | Payer: Medicare Other | Source: Ambulatory Visit | Attending: Urology | Admitting: Urology

## 2016-11-28 DIAGNOSIS — C61 Malignant neoplasm of prostate: Secondary | ICD-10-CM

## 2016-11-28 MED ORDER — TECHNETIUM TC 99M MEDRONATE IV KIT
20.2000 | PACK | Freq: Once | INTRAVENOUS | Status: AC | PRN
  Administered 2016-11-28: 20.2 via INTRAVENOUS

## 2017-03-14 ENCOUNTER — Other Ambulatory Visit: Payer: Self-pay | Admitting: Urology

## 2017-03-14 DIAGNOSIS — C61 Malignant neoplasm of prostate: Secondary | ICD-10-CM

## 2017-05-20 ENCOUNTER — Ambulatory Visit (HOSPITAL_COMMUNITY)
Admission: RE | Admit: 2017-05-20 | Discharge: 2017-05-20 | Disposition: A | Discharge status: Home / Self Care | Payer: Medicare Other | Source: Ambulatory Visit | Attending: Urology | Admitting: Urology

## 2017-05-20 DIAGNOSIS — C61 Malignant neoplasm of prostate: Secondary | ICD-10-CM | POA: Diagnosis not present

## 2017-05-20 MED ORDER — TECHNETIUM TC 99M MEDRONATE IV KIT
20.8000 | PACK | Freq: Once | INTRAVENOUS | Status: AC | PRN
  Administered 2017-05-20: 20.8 via INTRAVENOUS

## 2017-07-08 IMAGING — NM NM BONE WHOLE BODY
2 series · 2 of 2 positions shown · non-contrast
Comparison: Bone scan 01/31/2015, baseline 03/27/2013

CLINICAL DATA: Prostate cancer.EMBARK RESEARCH STUDY [id number]

EXAM:
NUCLEAR MEDICINE WHOLE BODY BONE SCAN
TECHNIQUE: Whole body anterior and posterior images were obtained approximately
3 hours after intravenous injection of radiopharmaceutical.
RADIOPHARMACEUTICALS:  Twenty-seven mCi 1echnetium-XXm MDP IV

[Series 1: whole body · 2.66mm/px · 1 of 1 slices shown (1 of 2)]
[im 1/1]
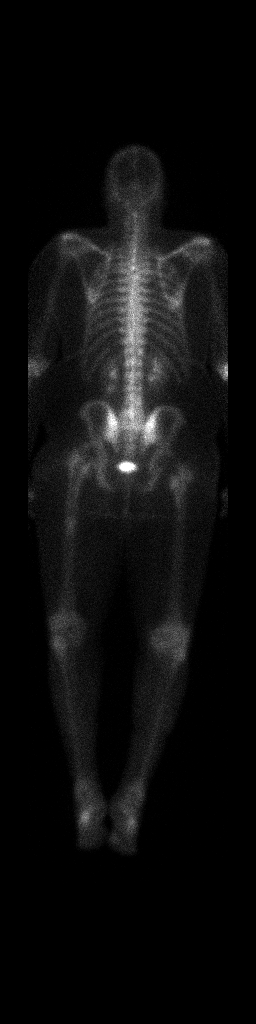

[Series 1: whole body · 2.66mm/px · 1 of 1 slices shown (2 of 2)]
[im 1/1]
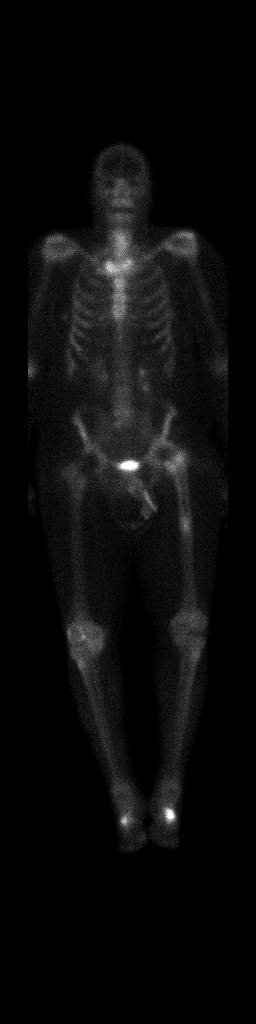

[2 of 2 positions shown; findings below may reference images not displayed]

8ITDE Measurements for Bone Metastasis:

No evidence of new skeletal lesions.  No metastasis.
FINDINGS: No focal uptake within the axillary or appendicular skeleton to
suggest metastatic disease. Symmetric uptake in the iliac wings.
Photopenia noted in the hip prosthetics. Degenerate change at the
sternoclavicular joint trans. Degenerative uptake in the LEFT and
RIGHT midfoot.
IMPRESSION: No evidence of skeletal metastasis.

## 2017-08-12 ENCOUNTER — Other Ambulatory Visit: Payer: Self-pay | Admitting: Urology

## 2017-08-12 DIAGNOSIS — C61 Malignant neoplasm of prostate: Secondary | ICD-10-CM

## 2017-11-04 ENCOUNTER — Encounter (HOSPITAL_COMMUNITY)
Admission: RE | Admit: 2017-11-04 | Discharge: 2017-11-04 | Disposition: A | Discharge status: Home / Self Care | Payer: Medicare Other | Source: Ambulatory Visit | Attending: Urology | Admitting: Urology

## 2017-11-04 DIAGNOSIS — C61 Malignant neoplasm of prostate: Secondary | ICD-10-CM

## 2017-11-04 MED ORDER — TECHNETIUM TC 99M MEDRONATE IV KIT
20.8000 | PACK | Freq: Once | INTRAVENOUS | Status: AC | PRN
  Administered 2017-11-04: 20.8 via INTRAVENOUS

## 2018-04-15 ENCOUNTER — Other Ambulatory Visit (HOSPITAL_COMMUNITY): Payer: Self-pay | Admitting: Urology

## 2018-04-15 ENCOUNTER — Other Ambulatory Visit: Payer: Self-pay | Admitting: Urology

## 2018-04-15 DIAGNOSIS — C61 Malignant neoplasm of prostate: Secondary | ICD-10-CM

## 2018-04-21 ENCOUNTER — Encounter: Payer: Self-pay | Admitting: Internal Medicine

## 2018-05-01 ENCOUNTER — Ambulatory Visit (HOSPITAL_COMMUNITY)
Admission: RE | Admit: 2018-05-01 | Discharge: 2018-05-01 | Disposition: A | Discharge status: Home / Self Care | Payer: Medicare Other | Source: Ambulatory Visit | Attending: Urology | Admitting: Urology

## 2018-05-01 DIAGNOSIS — C61 Malignant neoplasm of prostate: Secondary | ICD-10-CM | POA: Insufficient documentation

## 2018-05-01 MED ORDER — TECHNETIUM TC 99M MEDRONATE IV KIT
21.4000 | PACK | Freq: Once | INTRAVENOUS | Status: AC
  Administered 2018-05-01: 21.4 via INTRAVENOUS

## 2018-05-05 ENCOUNTER — Ambulatory Visit (AMBULATORY_SURGERY_CENTER): Payer: Self-pay

## 2018-05-05 VITALS — Ht 74.0 in | Wt 240.2 lb

## 2018-05-05 DIAGNOSIS — Z800 Family history of malignant neoplasm of digestive organs: Secondary | ICD-10-CM

## 2018-05-05 DIAGNOSIS — Z86010 Personal history of colonic polyps: Secondary | ICD-10-CM

## 2018-05-05 NOTE — Progress Notes (Signed)
Per pt, no allergies to soy or egg products.Pt not taking any weight loss meds or using  O2 at home.  Pt refused emmi video. 

## 2018-05-19 ENCOUNTER — Encounter: Payer: Self-pay | Admitting: Internal Medicine

## 2018-05-26 ENCOUNTER — Encounter: Payer: Self-pay | Admitting: Internal Medicine

## 2018-05-27 ENCOUNTER — Encounter: Payer: Self-pay | Admitting: Internal Medicine

## 2018-05-27 ENCOUNTER — Telehealth: Payer: Self-pay | Admitting: Internal Medicine

## 2018-05-27 NOTE — Telephone Encounter (Signed)
Hi Dr. Carlean Purl, this pt just had to cancel his colonoscopy for today due to his ride having an emergency. He has rescheduled to 2/24. Thank you.

## 2018-05-27 NOTE — Telephone Encounter (Signed)
Ok no charge

## 2018-06-09 ENCOUNTER — Telehealth: Payer: Self-pay | Admitting: Internal Medicine

## 2018-06-09 ENCOUNTER — Encounter: Payer: Self-pay | Admitting: Internal Medicine

## 2018-06-09 NOTE — Telephone Encounter (Signed)
Pt is sched for 3:00 PM colon today.  Pt called to inform that he is not "completely cleared out."  Pt stated he only took half of the prep soln.  Pt stated he could not afford to buy the new prep soln so he took what he had from the remainder of last sched (canceled) colonoscopy on 2.11.20 .

## 2018-06-09 NOTE — Telephone Encounter (Signed)
Discussed with Dr. Carlean Purl, pt will need to cancel today and reschedule for a previsit and new procedure. Pt will need to receive a sample prep for procedure.  New appointments scheduled.

## 2018-06-16 ENCOUNTER — Ambulatory Visit (AMBULATORY_SURGERY_CENTER): Payer: Medicare Other

## 2018-06-16 VITALS — Ht 74.0 in | Wt 233.0 lb

## 2018-06-16 DIAGNOSIS — Z86010 Personal history of colonic polyps: Secondary | ICD-10-CM

## 2018-06-16 DIAGNOSIS — Z800 Family history of malignant neoplasm of digestive organs: Secondary | ICD-10-CM

## 2018-06-16 NOTE — Progress Notes (Signed)
Denies allergies to eggs or soy products. Denies complication of anesthesia or sedation. Denies use of weight loss medication. Denies use of O2.   Emmi instructions declined.   A sample of Suprep was given to the patient per Dr. Carlean Purl. Extra time was spent with the patient reviewing instructions.

## 2018-06-30 ENCOUNTER — Other Ambulatory Visit: Payer: Self-pay

## 2018-06-30 ENCOUNTER — Encounter: Payer: Self-pay | Admitting: Internal Medicine

## 2018-06-30 ENCOUNTER — Ambulatory Visit (AMBULATORY_SURGERY_CENTER): Payer: Self-pay | Admitting: Internal Medicine

## 2018-06-30 VITALS — BP 140/77 | HR 54 | Temp 97.8°F | Resp 11 | Ht 73.0 in | Wt 260.0 lb

## 2018-06-30 DIAGNOSIS — D125 Benign neoplasm of sigmoid colon: Secondary | ICD-10-CM

## 2018-06-30 DIAGNOSIS — D123 Benign neoplasm of transverse colon: Secondary | ICD-10-CM | POA: Diagnosis not present

## 2018-06-30 DIAGNOSIS — D120 Benign neoplasm of cecum: Secondary | ICD-10-CM | POA: Diagnosis not present

## 2018-06-30 DIAGNOSIS — K635 Polyp of colon: Secondary | ICD-10-CM

## 2018-06-30 DIAGNOSIS — Z860100 Personal history of colon polyps, unspecified: Secondary | ICD-10-CM

## 2018-06-30 DIAGNOSIS — D129 Benign neoplasm of anus and anal canal: Secondary | ICD-10-CM

## 2018-06-30 DIAGNOSIS — D124 Benign neoplasm of descending colon: Secondary | ICD-10-CM

## 2018-06-30 DIAGNOSIS — D128 Benign neoplasm of rectum: Secondary | ICD-10-CM

## 2018-06-30 MED ORDER — SODIUM CHLORIDE 0.9 % IV SOLN
500.0000 mL | INTRAVENOUS | Status: DC
Start: 2018-06-30 — End: 2018-06-30

## 2018-06-30 NOTE — Progress Notes (Signed)
PT taken to PACU. Monitors in place. VSS. Report given to RN. 

## 2018-06-30 NOTE — Patient Instructions (Addendum)
   I found and removed 10 small polyps.  I will let you know pathology results and when to have another routine colonoscopy by mail and/or My Chart.  I appreciate the opportunity to care for you. Gatha Mayer, MD, [facility]  Handouts given for polyps, diverticulosis and hemorrhoids.YOU HAD AN ENDOSCOPIC PROCEDURE TODAY AT [facility]:   Refer to the procedure report that was given to you for any specific questions about what was found during the examination.  If the procedure report does not answer your questions, please call your gastroenterologist to clarify.  If you requested that your care partner not be given the details of your procedure findings, then the procedure report has been included in a sealed envelope for you to review at your convenience later.  YOU SHOULD EXPECT: Some feelings of bloating in the abdomen. Passage of more gas than usual.  Walking can help get rid of the air that was put into your GI tract during the procedure and reduce the bloating. If you had a lower endoscopy (such as a colonoscopy or flexible sigmoidoscopy) you may notice spotting of blood in your stool or on the toilet paper. If you underwent a bowel prep for your procedure, you may not have a normal bowel movement for a few days.  Please Note:  You might notice some irritation and congestion in your nose or some drainage.  This is from the oxygen used during your procedure.  There is no need for concern and it should clear up in a day or so.  SYMPTOMS TO REPORT IMMEDIATELY:   Following lower endoscopy (colonoscopy or flexible sigmoidoscopy):  Excessive amounts of blood in the stool  Significant tenderness or worsening of abdominal pains  Swelling of the abdomen that is new, acute  Fever of 100F or higher    For urgent or emergent issues, a gastroenterologist can be reached at any hour by calling [phone].   DIET:  We do recommend a small meal at first, but then you may  proceed to your regular diet.  Drink plenty of fluids but you should avoid alcoholic beverages for 24 hours.  ACTIVITY:  You should plan to take it easy for the rest of today and you should NOT DRIVE or use heavy machinery until tomorrow (because of the sedation medicines used during the test).    FOLLOW UP: Our staff will call the number listed on your records the next business day following your procedure to check on you and address any questions or concerns that you may have regarding the information given to you following your procedure. If we do not reach you, we will leave a message.  However, if you are feeling well and you are not experiencing any problems, there is no need to return our call.  We will assume that you have returned to your regular daily activities without incident.  If any biopsies were taken you will be contacted by phone or by letter within the next 1-3 weeks.  Please call us at [phone] if you have not heard about the biopsies in 3 weeks.    SIGNATURES/CONFIDENTIALITY: You and/or your care partner have signed paperwork which will be entered into your electronic medical record.  These signatures attest to the fact that that the information above on your After Visit Summary has been reviewed and is understood.  Full responsibility of the confidentiality of this discharge information lies with you and/or your care-partner.

## 2018-06-30 NOTE — Op Note (Signed)
[place] Patient Name: Ruben Andrews Procedure Date: 06/30/2018 1:26 PM MRN: [id number] Endoscopist: Gatha Mayer , MD Age: 63 Referring MD:  Date of Birth: 1955/05/27 Gender: Male Account #: [id number] Procedure:                Colonoscopy Indications:              Surveillance: Personal history of adenomatous                            polyps on last colonoscopy 5 years ago Medicines:                Propofol per Anesthesia, Monitored Anesthesia Care Procedure:                Pre-Anesthesia Assessment:                           - Prior to the procedure, a History and Physical                            was performed, and patient medications and                            allergies were reviewed. The patient's tolerance of                            previous anesthesia was also reviewed. The risks                            and benefits of the procedure and the sedation                            options and risks were discussed with the patient.                            All questions were answered, and informed consent                            was obtained. Prior Anticoagulants: The patient has                            taken no previous anticoagulant or antiplatelet                            agents. ASA Grade Assessment: II - A patient with                            mild systemic disease. After reviewing the risks                            and benefits, the patient was deemed in                            satisfactory condition to undergo the procedure.  After obtaining informed consent, the colonoscope                            was passed under direct vision. Throughout the                            procedure, the patient's blood pressure, pulse, and                            oxygen saturations were monitored continuously. The                            Colonoscope was introduced through the anus and   advanced to the the cecum, identified by                            appendiceal orifice and ileocecal valve. The                            colonoscopy was performed without difficulty. The                            patient tolerated the procedure well. The quality                            of the bowel preparation was adequate. The bowel                            preparation used was SUPREP. The ileocecal valve,                            appendiceal orifice, and rectum were photographed. Scope In: 1:45:34 PM Scope Out: 2:04:43 PM Scope Withdrawal Time: 0 hours 15 minutes 14 seconds  Total Procedure Duration: 0 hours 19 minutes 9 seconds  Findings:                 The perianal and digital rectal examinations were                            normal. Pertinent negatives include normal prostate                            (size, shape, and consistency).                           Ten sessile polyps were found in the rectum,                            sigmoid colon, descending colon, transverse colon                            and cecum. The polyps were diminutive in size.                            These polyps were  removed with a cold snare.                            Resection and retrieval were complete. Verification                            of patient identification for the specimen was                            done. Estimated blood loss was minimal.                           Multiple diverticula were found in the entire colon.                           External and internal hemorrhoids were found during                            retroflexion. Complications:            No immediate complications. Estimated Blood Loss:     Estimated blood loss was minimal. Impression:               - Ten diminutive polyps in the rectum, in the                            sigmoid colon, in the descending colon, in the                            transverse colon and in the cecum, removed with a                             cold snare. Resected and retrieved.                           - Diverticulosis in the entire examined colon.                           - External and internal hemorrhoids. Recommendation:           - Patient has a contact number available for                            emergencies. The signs and symptoms of potential                            delayed complications were discussed with the                            patient. Return to normal activities tomorrow.                            Written discharge instructions were provided to the                            patient.                           -  Resume previous diet.                           - Continue present medications.                           - Repeat colonoscopy is recommended for                            surveillance. The colonoscopy date will be                            determined after pathology results from today's                            exam become available for review.                           - Also Family History of Colon Cancer age 67 Gatha Mayer, MD 06/30/2018 2:21:53 PM This report has been signed electronically.

## 2018-06-30 NOTE — Progress Notes (Signed)
Called to room to assist during endoscopic procedure.  Patient ID and intended procedure confirmed with present staff. Received instructions for my participation in the procedure from the performing physician.  

## 2018-06-30 NOTE — Progress Notes (Signed)
Pt's states no medical or surgical changes since previsit or office visit. 

## 2018-07-01 ENCOUNTER — Telehealth: Payer: Self-pay

## 2018-07-01 NOTE — Telephone Encounter (Signed)
  Follow up Call-  Call back number 06/30/2018  Post procedure Call Back phone  # [phone]  Permission to leave phone message Yes  Some recent data might be hidden     Patient questions:  Do you have a fever, pain , or abdominal swelling? No. Pain Score  0 *  Have you tolerated food without any problems? Yes.    Have you been able to return to your normal activities? Yes.    Do you have any questions about your discharge instructions: Diet   No. Medications  No. Follow up visit  No.  Do you have questions or concerns about your Care? No.  Actions: * If pain score is 4 or above: No action needed, pain <4.

## 2018-07-04 ENCOUNTER — Encounter: Payer: Self-pay | Admitting: Internal Medicine

## 2018-07-04 NOTE — Progress Notes (Signed)
10 adenomas Recall 1 year Suggest genetics evaluation

## 2018-07-27 ENCOUNTER — Other Ambulatory Visit: Payer: Self-pay

## 2018-07-27 ENCOUNTER — Encounter (HOSPITAL_COMMUNITY): Payer: Self-pay

## 2018-07-27 ENCOUNTER — Ambulatory Visit (HOSPITAL_COMMUNITY)
Admission: EM | Admit: 2018-07-27 | Discharge: 2018-07-27 | Disposition: A | Discharge status: Home / Self Care | Payer: Medicare Other | Attending: Family Medicine | Admitting: Family Medicine

## 2018-07-27 DIAGNOSIS — F411 Generalized anxiety disorder: Secondary | ICD-10-CM

## 2018-07-27 DIAGNOSIS — R319 Hematuria, unspecified: Secondary | ICD-10-CM | POA: Diagnosis present

## 2018-07-27 DIAGNOSIS — I10 Essential (primary) hypertension: Secondary | ICD-10-CM

## 2018-07-27 DIAGNOSIS — N390 Urinary tract infection, site not specified: Secondary | ICD-10-CM

## 2018-07-27 DIAGNOSIS — C61 Malignant neoplasm of prostate: Secondary | ICD-10-CM | POA: Diagnosis not present

## 2018-07-27 DIAGNOSIS — Z96649 Presence of unspecified artificial hip joint: Secondary | ICD-10-CM

## 2018-07-27 LAB — POCT URINALYSIS DIP (DEVICE)
Bilirubin Urine: NEGATIVE
Glucose, UA: NEGATIVE mg/dL
Ketones, ur: NEGATIVE mg/dL
Nitrite: NEGATIVE
Protein, ur: NEGATIVE mg/dL
Specific Gravity, Urine: >=1.03 (ref 1.005–1.030)
Urobilinogen, UA: 0.2 mg/dL (ref 0.0–1.0)
pH: 6 (ref 5.0–8.0)

## 2018-07-27 MED ORDER — SULFAMETHOXAZOLE-TRIMETHOPRIM 800-160 MG PO TABS: 1.0000 | ORAL_TABLET | Freq: Two times a day (BID) | ORAL | 0 refills | Status: AC

## 2018-07-27 NOTE — ED Provider Notes (Signed)
[place]    CSN: [id number] Arrival date & time: 07/27/18  1004     History   Chief Complaint Chief Complaint  Patient presents with  . Urinary Tract Infection    HPI NASH BOLLS is a 63 y.o. male.   HPI  Patient states that for the past 4 to 5 days he has had fleeting pains in his left flank.  Some urinary frequency.  Intermittent blood in his urine.  Today he states that the toilet water was red with blood, and he developed concern.  He does not have a history of bladder or kidney problems although he is under treatment for metastatic prostate cancer, and is on an experimental medication.  Sometimes feels like he has difficulty emptying his bladder.  No fever chills.  No nausea vomiting.  No concern regarding STD. Patient's blood pressure is noted to be elevated this morning.  He states that when he had hematuria he was "rushed out of the house" without taking his daily pill.  He is reminded to take his medicine as soon as he gets home.   Past Medical History:  Diagnosis Date  . Anxiety   . Arthritis   . Cancer [facility]) 2007   prostate/last chemo in 2007  . Depression   . Diverticulosis   . GERD (gastroesophageal reflux disease)    no meds  . Hypertension   . Prostate cancer ([place]) 2007  . Shortness of breath dyspnea    sometimes with excertion    Patient Active Problem List   Diagnosis Date Noted  . Prostate cancer ([place]) 07/27/2018  . 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 Essential hypertension 07/27/2018  . GAD (generalized anxiety disorder) 07/27/2018  . Personal history of colonic polyps 03/30/2013  . Family history of colon cancer - brother - 99 03/30/2013    Past Surgical History:  Procedure Laterality Date  . COLONOSCOPY  last 2009   in [place]  . HIP ARTHROPLASTY Bilateral 2010-2012  . HYDROCELE EXCISION Bilateral 07/19/2015   Procedure: HYDROCELECTOMY ADULT BILATERAL ;  Surgeon: Irine Seal, MD;  Location: [facility];  Service: Urology;  Laterality: Bilateral;  . INGUINAL HERNIA REPAIR Right 07/19/2015   Procedure: HERNIA REPAIR INGUINAL ADULT;  Surgeon: Mickeal Skinner, MD;  Location: [facility];  Service: General;  Laterality: Right;  . INSERTION OF MESH Right 07/19/2015   Procedure: INSERTION OF MESH;  Surgeon: Mickeal Skinner, MD;  Location: [facility];  Service: General;  Laterality: Right;  . PROSTATECTOMY  2007  . SUPRA-UMBILICAL HERNIA  2025  . TRANSURETHRAL RESECTION OF PROSTATE     XRT       Home Medications    Prior to Admission medications   Medication Sig Start Date End Date Taking? Authorizing Provider  ALPRAZolam Duanne Moron) 0.5 MG tablet Take 0.5 mg by mouth as needed for anxiety.    [provider]  amoxicillin (AMOXIL) 875 MG tablet Take 875 mg by mouth 2 (two) times daily.    [provider]  LISINOPRIL PO Take 10 mg by mouth every morning.     [provider]  STUDY MEDICATION Medication for prostate    [provider]  sulfamethoxazole-trimethoprim (BACTRIM DS,SEPTRA DS) 800-160 MG tablet Take 1 tablet by mouth 2 (two) times daily for 7 days. 07/27/18 08/03/18  Raylene Everts, MD    Family History Family History  Problem Relation Age of Onset  . Colon  cancer Brother 83  . Diabetes Mother   . Diabetes Father   . Heart disease Father   . Hypertension Other   . Hyperlipidemia Other   . Breast cancer Sister   . Stomach cancer Neg Hx   . Esophageal cancer Neg Hx   . Rectal cancer Neg Hx     Social History Social History   Tobacco Use  . Smoking status: Current Every Day Smoker    Packs/day: 1.00    Years: 30.00    Pack years: 30.00    Types: Cigarettes  . Smokeless tobacco: Never Used  Substance Use Topics  . Alcohol use: Yes    Alcohol/week: 0.0 standard drinks    Comment: rare  . Drug use: No     Allergies   Patient has no known allergies.   Review of Systems  Review of Systems  Constitutional: Negative for chills and fever.  HENT: Negative for ear pain and sore throat.   Eyes: Negative for pain and visual disturbance.  Respiratory: Negative for cough and shortness of breath.   Cardiovascular: Negative for chest pain and palpitations.  Gastrointestinal: Negative for abdominal pain and vomiting.  Genitourinary: Positive for difficulty urinating, flank pain, frequency and hematuria. Negative for dysuria.  Musculoskeletal: Negative for arthralgias and back pain.  Skin: Negative for color change and rash.  Neurological: Negative for seizures and syncope.  All other systems reviewed and are negative.    Physical Exam Triage Vital Signs ED Triage Vitals  Enc Vitals Group     BP 07/27/18 1015 (!) 156/100     Pulse Rate 07/27/18 1015 75     Resp --      Temp 07/27/18 1015 97.6 F (36.4 C)     Temp Source 07/27/18 1015 Oral     SpO2 07/27/18 1015 98 %     Weight 07/27/18 1017 232 lb (105.2 kg)     Height --      Head Circumference --      Peak Flow --      Pain Score 07/27/18 1016 1     Pain Loc --      Pain Edu? --      Excl. in [place]? --    No data found.  Updated Vital Signs BP (!) 156/100 (BP Location: Left Arm)   Pulse 75   Temp 97.6 F (36.4 C) (Oral)   Wt 105.2 kg   SpO2 98%   BMI 30.61 kg/m     Physical Exam Constitutional:      General: He is not in acute distress.    Appearance: He is well-developed.  HENT:     Head: Normocephalic and atraumatic.  Eyes:     Conjunctiva/sclera: Conjunctivae normal.     Pupils: Pupils are equal, round, and reactive to light.  Neck:     Musculoskeletal: Normal range of motion.  Cardiovascular:     Rate and Rhythm: Normal rate and regular rhythm.     Heart sounds: Normal heart sounds.  Pulmonary:     Effort: Pulmonary effort is normal. No respiratory distress.     Breath sounds: Normal breath sounds.     Comments: Gynecomastia Chest:     Chest wall: No tenderness.  Abdominal:      General: There is no distension.     Palpations: Abdomen is soft.     Tenderness: There is right CVA tenderness. There is no left CVA tenderness.     Comments: No abdominal tenderness.  No  CVA tenderness  Musculoskeletal: Normal range of motion.  Skin:    General: Skin is warm and dry.  Neurological:     Mental Status: He is alert.  Psychiatric:        Mood and Affect: Mood normal.        Behavior: Behavior normal.      UC Treatments / Results  Labs (all labs ordered are listed, but only abnormal results are displayed) Labs Reviewed  POCT URINALYSIS DIP (DEVICE) - Abnormal; Notable for the following components:      Result Value   Hgb urine dipstick MODERATE (*)    Leukocytes,Ua SMALL (*)    All other components within normal limits  URINE CULTURE    EKG None  Radiology No results found.  Procedures Procedures (including critical care time)  Medications Ordered in UC Medications - No data to display  Initial Impression / Assessment and Plan / UC Course  I have reviewed the triage vital signs and the nursing notes.  Pertinent labs & imaging results that were available during my care of the patient were reviewed by me and considered in my medical decision making (see chart for details).     We discussed that the hematuria can be from a number of things.  Bladder infection/bladder stone.  Kidney infection/kidney stone.  Malignancy.  The prostate disease could be causing enough urinary retention to cause cystitis.  Difficult for me to give him more information at the urgent care center but we will treat him with antibiotics and then follow-up with urology. Final Clinical Impressions(s) / UC Diagnoses   Final diagnoses:  Lower urinary tract infectious disease  Hematuria, unspecified type     Discharge Instructions     Continue to drink increased water Take the antibiotic 2 x a day for a week I have cultured your urine, and this will confirm an infection  Call your urologist on Monday.  They will need to see you in follow up   ED Prescriptions    Medication Sig Dispense Auth. Provider   sulfamethoxazole-trimethoprim (BACTRIM DS,SEPTRA DS) 800-160 MG tablet Take 1 tablet by mouth 2 (two) times daily for 7 days. 14 tablet Raylene Everts, MD     Controlled Substance Prescriptions [place] Controlled Substance Registry consulted? Not Applicable   Raylene Everts, MD 07/27/18 [phone]

## 2018-07-27 NOTE — Discharge Instructions (Signed)
Continue to drink increased water Take the antibiotic 2 x a day for a week I have cultured your urine, and this will confirm an infection Call your urologist on Monday.  They will need to see you in follow up

## 2018-07-27 NOTE — ED Triage Notes (Signed)
Pt states he thinks he has a UTI. X 5 days Pt has seen blood urine this morning.

## 2018-07-29 LAB — URINE CULTURE: Culture: >=100000 — AB

## 2018-07-30 ENCOUNTER — Other Ambulatory Visit: Payer: Self-pay

## 2018-07-30 ENCOUNTER — Telehealth: Payer: Self-pay | Admitting: Internal Medicine

## 2018-07-30 ENCOUNTER — Telehealth (HOSPITAL_COMMUNITY): Payer: Self-pay | Admitting: Emergency Medicine

## 2018-07-30 DIAGNOSIS — Z86010 Personal history of colonic polyps: Secondary | ICD-10-CM

## 2018-07-30 DIAGNOSIS — Z800 Family history of malignant neoplasm of digestive organs: Secondary | ICD-10-CM

## 2018-07-30 NOTE — Telephone Encounter (Signed)
Attempted to reach patient. No answer at this time.   

## 2018-07-30 NOTE — Telephone Encounter (Signed)
Pt called in wanting to speak with the nurse to go over results with him that he received in the mail.

## 2018-07-30 NOTE — Progress Notes (Signed)
Discussed results.  All questions answered.  Referral order placed for genetics.He understands that they probably will not be scheduled until after Covid

## 2018-07-31 ENCOUNTER — Telehealth: Payer: Self-pay | Admitting: Licensed Clinical Social Worker

## 2018-07-31 ENCOUNTER — Encounter: Payer: Self-pay | Admitting: Licensed Clinical Social Worker

## 2018-07-31 NOTE — Telephone Encounter (Signed)
A genetic counseling appt has been scheduled for the pt to see Faith Rogue on 6/22 at 2pm. Letter mailed.

## 2018-09-25 ENCOUNTER — Other Ambulatory Visit (HOSPITAL_COMMUNITY): Payer: Self-pay | Admitting: Urology

## 2018-09-25 DIAGNOSIS — C61 Malignant neoplasm of prostate: Secondary | ICD-10-CM

## 2018-10-01 ENCOUNTER — Telehealth: Payer: Self-pay | Admitting: Licensed Clinical Social Worker

## 2018-10-01 NOTE — Telephone Encounter (Signed)
Called patient regarding upcoming [facility] appointment, patient requested for this appointment to be cancelled. Patient will call back when ready to reschedule.

## 2018-10-06 ENCOUNTER — Inpatient Hospital Stay: Payer: Medicare Other

## 2018-10-06 ENCOUNTER — Inpatient Hospital Stay: Payer: Medicare Other | Admitting: Licensed Clinical Social Worker

## 2018-10-09 ENCOUNTER — Encounter (HOSPITAL_COMMUNITY): Payer: Self-pay

## 2018-10-09 ENCOUNTER — Ambulatory Visit (HOSPITAL_COMMUNITY): Payer: Medicare Other

## 2018-10-09 ENCOUNTER — Encounter (HOSPITAL_COMMUNITY): Payer: Medicare Other

## 2018-12-29 ENCOUNTER — Encounter: Payer: Medicare Other | Attending: Urology

## 2018-12-29 DIAGNOSIS — C61 Malignant neoplasm of prostate: Secondary | ICD-10-CM | POA: Insufficient documentation

## 2019-01-08 ENCOUNTER — Other Ambulatory Visit (HOSPITAL_COMMUNITY): Payer: Self-pay | Admitting: Urology

## 2019-01-08 ENCOUNTER — Other Ambulatory Visit: Payer: Self-pay | Admitting: Urology

## 2019-01-08 DIAGNOSIS — C61 Malignant neoplasm of prostate: Secondary | ICD-10-CM

## 2019-01-21 ENCOUNTER — Encounter (HOSPITAL_COMMUNITY): Payer: Medicare Other

## 2019-01-21 ENCOUNTER — Encounter (HOSPITAL_COMMUNITY): Payer: Self-pay

## 2019-03-03 ENCOUNTER — Other Ambulatory Visit: Payer: Self-pay | Admitting: Internal Medicine

## 2019-03-03 DIAGNOSIS — Z720 Tobacco use: Secondary | ICD-10-CM

## 2019-03-09 ENCOUNTER — Other Ambulatory Visit: Payer: Self-pay | Admitting: Internal Medicine

## 2019-03-09 DIAGNOSIS — Z720 Tobacco use: Secondary | ICD-10-CM

## 2019-03-16 ENCOUNTER — Ambulatory Visit: Payer: Medicare Other

## 2019-03-24 ENCOUNTER — Ambulatory Visit: Payer: Medicare Other

## 2019-03-24 ENCOUNTER — Other Ambulatory Visit: Payer: Medicare Other

## 2019-04-01 ENCOUNTER — Inpatient Hospital Stay: Admission: RE | Admit: 2019-04-01 | Payer: Medicare Other | Source: Ambulatory Visit

## 2019-04-01 ENCOUNTER — Other Ambulatory Visit: Payer: Medicare Other

## 2019-04-16 ENCOUNTER — Other Ambulatory Visit: Payer: Medicare Other

## 2019-04-16 ENCOUNTER — Ambulatory Visit: Payer: Medicare Other

## 2019-05-05 ENCOUNTER — Ambulatory Visit
Admission: RE | Admit: 2019-05-05 | Discharge: 2019-05-05 | Disposition: A | Discharge status: Home / Self Care | Payer: Medicare Other | Source: Ambulatory Visit | Attending: Internal Medicine | Admitting: Internal Medicine

## 2019-05-05 ENCOUNTER — Other Ambulatory Visit: Payer: Medicare Other

## 2019-05-05 ENCOUNTER — Other Ambulatory Visit: Payer: Self-pay

## 2019-05-05 DIAGNOSIS — Z720 Tobacco use: Secondary | ICD-10-CM

## 2019-05-25 ENCOUNTER — Ambulatory Visit (HOSPITAL_COMMUNITY)
Admission: EM | Admit: 2019-05-25 | Discharge: 2019-05-25 | Disposition: A | Discharge status: Home / Self Care | Payer: Medicare Other | Attending: Family Medicine | Admitting: Family Medicine

## 2019-05-25 ENCOUNTER — Other Ambulatory Visit: Payer: Self-pay

## 2019-05-25 ENCOUNTER — Encounter (HOSPITAL_COMMUNITY): Payer: Self-pay

## 2019-05-25 DIAGNOSIS — Z20822 Contact with and (suspected) exposure to covid-19: Secondary | ICD-10-CM | POA: Insufficient documentation

## 2019-05-25 NOTE — ED Triage Notes (Signed)
Pt states he just wants COVID testing & he is denying ALL signs/symptoms. Informed pt his test would result in 2-3 days.

## 2019-05-25 NOTE — ED Provider Notes (Signed)
[place]    CSN: [id number] Arrival date & time: 05/25/19  [id number]      History   Chief Complaint Chief Complaint  Patient presents with  . COVID testing    HPI Ruben Andrews is a 64 y.o. male.   HPI  Patient has unknown exposure to coronavirus Patient has no symptoms of coronavirus He is here requesting coronavirus test because he worries about contracting the disease.  He wants the test "just to be sure"  Past Medical History:  Diagnosis Date  . Anxiety   . Arthritis   . Cancer [facility]) 2007   prostate/last chemo in 2007  . Depression   . Diverticulosis   . GERD (gastroesophageal reflux disease)    no meds  . Hypertension   . Prostate cancer ([place]) 2007  . Shortness of breath dyspnea    sometimes with excertion    Patient Active Problem List   Diagnosis Date Noted  . Prostate cancer ([place]) 07/27/2018  . 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 Essential hypertension 07/27/2018  . GAD (generalized anxiety disorder) 07/27/2018  . Personal history of colonic polyps 03/30/2013  . Family history of colon cancer - brother - 73 03/30/2013    Past Surgical History:  Procedure Laterality Date  . COLONOSCOPY  last 2009   in [place]  . HIP ARTHROPLASTY Bilateral 2010-2012  . HYDROCELE EXCISION Bilateral 07/19/2015   Procedure: HYDROCELECTOMY ADULT BILATERAL ;  Surgeon: Irine Seal, MD;  Location: [facility];  Service: Urology;  Laterality: Bilateral;  . INGUINAL HERNIA REPAIR Right 07/19/2015   Procedure: HERNIA REPAIR INGUINAL ADULT;  Surgeon: Mickeal Skinner, MD;  Location: [facility];  Service: General;  Laterality: Right;  . INSERTION OF MESH Right 07/19/2015   Procedure: INSERTION OF MESH;  Surgeon: Mickeal Skinner, MD;  Location: [facility];  Service: General;  Laterality: Right;  . PROSTATECTOMY  2007  . SUPRA-UMBILICAL HERNIA  [id number]  . TRANSURETHRAL RESECTION OF PROSTATE     XRT         Home Medications    Prior to Admission medications   Medication Sig Start Date End Date Taking? Authorizing Provider  ALPRAZolam Duanne Moron) 0.5 MG tablet Take 0.5 mg by mouth as needed for anxiety.    [provider]  lisinopril (ZESTRIL) 10 MG tablet Take 10 mg by mouth daily. 04/28/19   [provider]  LISINOPRIL PO Take 10 mg by mouth every morning.     [provider]  rosuvastatin (CRESTOR) 5 MG tablet Take 5 mg by mouth at bedtime. 05/12/19   [provider]  STUDY MEDICATION Medication for prostate    [provider]    Family History Family History  Problem Relation Age of Onset  . Colon cancer Brother 39  . Diabetes Mother   . Diabetes Father   . Heart disease Father   . Hypertension Other   . Hyperlipidemia Other   . Breast cancer Sister   . Stomach cancer Neg Hx   . Esophageal cancer Neg Hx   . Rectal cancer Neg Hx     Social History Social History   Tobacco Use  . Smoking status: Current Every Day Smoker    Packs/day: 1.00    Years: 30.00    Pack years: 30.00    Types: Cigarettes  . Smokeless tobacco: Never Used  Substance Use Topics  . Alcohol use: Yes    Alcohol/week: 0.0  standard drinks    Comment: rare  . Drug use: No     Allergies   Patient has no known allergies.   Review of Systems Review of Systems  Constitutional: Negative.        Patient states he has no symptoms of coronavirus.  No fever chills body aches cough or shortness of breath     Physical Exam Triage Vital Signs ED Triage Vitals [05/25/19 1013]  Enc Vitals Group     BP (!) 155/78     Pulse Rate 63     Resp 18     Temp 98 F (36.7 C)     Temp Source Oral     SpO2 100 %     Weight 214 lb 12.8 oz (97.4 kg)     Height      Head Circumference      Peak Flow      Pain Score 0     Pain Loc      Pain Edu?      Excl. in [place]?    No data found.  Updated Vital Signs BP (!) 155/78 (BP Location: Left Arm)   Pulse 63    Temp 98 F (36.7 C) (Oral)   Resp 18   Wt 97.4 kg   SpO2 100%   BMI 28.34 kg/m       Physical Exam Constitutional:      General: He is not in acute distress.    Appearance: Normal appearance. He is normal weight.     Comments: Patient appears well.  HENT:     Mouth/Throat:     Comments: Mask is in place Cardiovascular:     Rate and Rhythm: Normal rate and regular rhythm.  Pulmonary:     Effort: Pulmonary effort is normal. No respiratory distress.  Musculoskeletal:     Comments: Appears to move all extremities well.  Normal gait.  Neurological:     Mental Status: He is alert.     Coordination: Coordination normal.     Gait: Gait normal.      UC Treatments / Results  Labs (all labs ordered are listed, but only abnormal results are displayed) Labs Reviewed  NOVEL CORONAVIRUS, NAA (HOSP ORDER, SEND-OUT TO REF LAB; TAT 18-24 HRS)    EKG   Radiology No results found.  Procedures Procedures (including critical care time)  Medications Ordered in UC Medications - No data to display  Initial Impression / Assessment and Plan / UC Course  I have reviewed the triage vital signs and the nursing notes.  Pertinent labs & imaging results that were available during my care of the patient were reviewed by me and considered in my medical decision making (see chart for details).     Reviewed with patient that he needs to quarantine pending his test results Final Clinical Impressions(s) / UC Diagnoses   Final diagnoses:  Encounter for laboratory testing for COVID-19 virus     Discharge Instructions     Your test will be available through my chart It is recommended that you quarantine at home, avoid contact with others while waiting for test results    ED Prescriptions    None     PDMP not reviewed this encounter.   Raylene Everts, MD 05/25/19 1021

## 2019-05-25 NOTE — Discharge Instructions (Signed)
Your test will be available through my chart It is recommended that you quarantine at home, avoid contact with others while waiting for test results

## 2019-05-27 LAB — NOVEL CORONAVIRUS, NAA (HOSP ORDER, SEND-OUT TO REF LAB; TAT 18-24 HRS): SARS-CoV-2, NAA: NOT DETECTED

## 2019-07-24 ENCOUNTER — Telehealth: Payer: Self-pay

## 2019-07-24 NOTE — Telephone Encounter (Signed)
Pt called and r/s.

## 2019-07-24 NOTE — Telephone Encounter (Signed)
Pt NS PV.  Attempted home with no contact w/fup on mobile left VM to reschedule by 5 pm to avoid having procedure cancelled.

## 2019-08-04 ENCOUNTER — Encounter: Payer: Medicare Other | Admitting: Internal Medicine

## 2019-08-19 ENCOUNTER — Other Ambulatory Visit: Payer: Self-pay

## 2019-08-19 ENCOUNTER — Ambulatory Visit (AMBULATORY_SURGERY_CENTER): Payer: Self-pay | Admitting: *Deleted

## 2019-08-19 VITALS — Temp 96.4°F | Ht 73.5 in | Wt 203.4 lb

## 2019-08-19 DIAGNOSIS — Z01818 Encounter for other preprocedural examination: Secondary | ICD-10-CM

## 2019-08-19 DIAGNOSIS — Z86010 Personal history of colonic polyps: Secondary | ICD-10-CM

## 2019-08-19 NOTE — Progress Notes (Signed)
covid test 08-28-19 at 200 pm  Pt is aware that care partner will wait in the car during procedure; if they feel like they will be too hot or cold to wait in the car; they may wait in the 4 th floor lobby. Patient is aware to bring only one care partner. We want them to wear a mask (we do not have any that we can provide them), practice social distancing, and we will check their temperatures when they get here.  I did remind the patient that their care partner needs to stay in the parking lot the entire time and have a cell phone available, we will call them when the pt is ready for discharge. Patient will wear mask into building.   No trouble with anesthesia, difficulty with intubation or hx/fam hx of malignant hyperthermia per pt   No egg or soy allergy  No home oxygen use   No medications for weight loss taken  emmi information given  Pt denies constipation issues

## 2019-08-28 ENCOUNTER — Ambulatory Visit (INDEPENDENT_AMBULATORY_CARE_PROVIDER_SITE_OTHER): Payer: Medicare Other

## 2019-08-28 ENCOUNTER — Other Ambulatory Visit: Payer: Self-pay

## 2019-08-28 ENCOUNTER — Other Ambulatory Visit: Payer: Self-pay | Admitting: Internal Medicine

## 2019-08-28 DIAGNOSIS — Z1159 Encounter for screening for other viral diseases: Secondary | ICD-10-CM

## 2019-08-29 LAB — SARS CORONAVIRUS 2 (TAT 6-24 HRS): SARS Coronavirus 2: NEGATIVE

## 2019-09-02 ENCOUNTER — Encounter: Payer: Medicare Other | Admitting: Internal Medicine

## 2019-10-15 ENCOUNTER — Telehealth: Payer: Self-pay | Admitting: Internal Medicine

## 2019-10-15 NOTE — Telephone Encounter (Signed)
No charge. 

## 2019-10-15 NOTE — Telephone Encounter (Signed)
Patient called states he did not receive the prep medication so he wants to know if he can clean himself out with [employer]

## 2019-10-15 NOTE — Telephone Encounter (Signed)
Patient is scheduled to see Dr. Carlean Purl on 10/16/19 at 0830. I spoke with patient yesterday 10/14/19 in regards to covid test. He did not have one scheduled so I rescheduled his covid test for today at 1430, and went over prep instructions with him. Pt called office today in regards to his prep. He stated that he did not have the funds to purchase a new bottle of miralax, and wanted to know if he could drink epsom salt as his prep. I advised patient that he could not drink the epsom salt for preporation for his colonoscopy. I asked patient if he went to get his covid test done, and he informed me that he was not going to make it in time to get his test done. I informed patient that per [facility] policy we had to have a covid test done prior to his procedure. Patient wanted to reschedule his procedure to next week, in order to have time to gather funds together to get his Miralax and Dulcolax for his procedure prep. Schedulers will call patient to reschedule covid test and procedure.

## 2019-10-16 ENCOUNTER — Encounter: Payer: Medicare Other | Admitting: Internal Medicine

## 2019-12-02 ENCOUNTER — Ambulatory Visit (AMBULATORY_SURGERY_CENTER): Payer: Medicare Other | Admitting: *Deleted

## 2019-12-02 ENCOUNTER — Other Ambulatory Visit: Payer: Self-pay

## 2019-12-02 VITALS — Ht 73.0 in | Wt 215.0 lb

## 2019-12-02 DIAGNOSIS — Z800 Family history of malignant neoplasm of digestive organs: Secondary | ICD-10-CM

## 2019-12-02 DIAGNOSIS — Z86010 Personal history of colonic polyps: Secondary | ICD-10-CM

## 2019-12-02 DIAGNOSIS — Z01818 Encounter for other preprocedural examination: Secondary | ICD-10-CM

## 2019-12-02 MED ORDER — NA SULFATE-K SULFATE-MG SULF 17.5-3.13-1.6 GM/177ML PO SOLN: ORAL | 0 refills | Status: DC

## 2019-12-02 NOTE — Progress Notes (Signed)
Patient is here in-person for PV. Patient denies any allergies to eggs or soy. Patient denies any problems with anesthesia/sedation. Patient denies any oxygen use at home. Patient denies taking any diet/weight loss medications or blood thinners. Patient is not being treated for MRSA or C-diff. Patient is aware of our care-partner policy and [id number] safety protocol.  Patient's pre-visit was done today over the phone with the patient due to COVID-19 pandemic. Name,DOB and address verified. Insurance verified. Packet of Prep instructions mailed to patient including copy of a consent form and pre-procedure patient acknowledgement form-pt is aware. Patient understands to call us back with any questions or concerns. COVID-19 screening test is on 8/30, the patient is aware. Pt is aware that care partner will wait in the car during procedure; if they feel like they will be too hot or cold to wait in the car; they may wait in the 4 th floor lobby. Patient is aware to bring only one care partner. We want them to wear a mask (we do not have any that we can provide them), practice social distancing, and we will check their temperatures when they get here.  I did remind the patient that their care partner needs to stay in the parking lot the entire time and have a cell phone available, we will call them when the pt is ready for discharge. Patient will wear mask into building.

## 2019-12-14 ENCOUNTER — Other Ambulatory Visit: Payer: Self-pay | Admitting: Internal Medicine

## 2019-12-14 ENCOUNTER — Ambulatory Visit (INDEPENDENT_AMBULATORY_CARE_PROVIDER_SITE_OTHER): Payer: Medicare Other

## 2019-12-14 DIAGNOSIS — Z1159 Encounter for screening for other viral diseases: Secondary | ICD-10-CM

## 2019-12-14 LAB — SARS CORONAVIRUS 2 (TAT 6-24 HRS): SARS Coronavirus 2: NEGATIVE

## 2019-12-16 ENCOUNTER — Encounter: Payer: Self-pay | Admitting: Internal Medicine

## 2019-12-16 ENCOUNTER — Ambulatory Visit (AMBULATORY_SURGERY_CENTER): Payer: Medicare Other | Admitting: Internal Medicine

## 2019-12-16 ENCOUNTER — Other Ambulatory Visit: Payer: Self-pay

## 2019-12-16 VITALS — BP 157/91 | HR 51 | Temp 98.6°F | Resp 18 | Ht 73.0 in | Wt 215.0 lb

## 2019-12-16 DIAGNOSIS — D124 Benign neoplasm of descending colon: Secondary | ICD-10-CM | POA: Diagnosis not present

## 2019-12-16 DIAGNOSIS — Z86010 Personal history of colonic polyps: Secondary | ICD-10-CM

## 2019-12-16 DIAGNOSIS — D125 Benign neoplasm of sigmoid colon: Secondary | ICD-10-CM

## 2019-12-16 DIAGNOSIS — D120 Benign neoplasm of cecum: Secondary | ICD-10-CM

## 2019-12-16 MED ORDER — SODIUM CHLORIDE 0.9 % IV SOLN: 500.0000 mL | Freq: Once | INTRAVENOUS | Status: DC

## 2019-12-16 NOTE — Progress Notes (Signed)
VS-CW  Pt's states no medical or surgical changes since previsit or office visit.  

## 2019-12-16 NOTE — Op Note (Signed)
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 The patient's tolerance of                            previous anesthesia was also reviewed. The risks                            and benefits of the procedure and the sedation                            options and risks were discussed with the patient.                            All questions were answered, and informed consent                            was obtained. Prior Anticoagulants: The patient has                            taken no previous anticoagulant or antiplatelet                            agents. ASA Grade Assessment: III - A patient with                            severe systemic disease. After reviewing the risks                            and benefits, the patient was deemed in                            satisfactory condition to undergo the procedure.  After obtaining informed consent, the colonoscope                            was passed under direct vision. Throughout the                            procedure, the patient's blood pressure, pulse, and                            oxygen saturations were monitored continuously. The                            Colonoscope was introduced through the anus and                            advanced  to the the cecum, identified by                            appendiceal orifice and ileocecal valve. The                            colonoscopy was performed without difficulty. The                            patient tolerated the procedure well. The quality                            of the bowel preparation was good. The ileocecal                            valve, appendiceal orifice, and rectum were                            photographed. The bowel preparation used was                            Miralax via split dose instruction. Scope In: 9:21:47 AM Scope Out: 9:44:19 AM Scope Withdrawal Time: 0 hours 16 minutes 21 seconds  Total Procedure Duration: 0 hours 22 minutes 32 seconds  Findings:                 The perianal examination was normal.                           The digital rectal exam findings include flat                            prostate bed.                           Two sessile polyps were found in the sigmoid colon                            and descending colon. The polyps were diminutive in  size. These polyps were removed with a cold snare.                            Resection and retrieval were complete. Verification                            of patient identification for the specimen was                            done. Estimated blood loss was minimal.                           Two sessile polyps were found in the sigmoid colon                            and cecum. The polyps were 1 to 2 mm in size. These                            polyps were removed with a cold biopsy forceps.                            Resection and retrieval were complete. Verification                            of patient identification for the specimen was                            done. Estimated blood loss was minimal.                           Many small and large-mouthed diverticula were found                            in the entire colon.                            Internal hemorrhoids were found.                           The exam was otherwise without abnormality on                            direct and retroflexion views. Complications:            No immediate complications. Estimated Blood Loss:     Estimated blood loss was minimal. Impression:               - Flat prostate bed found on digital rectal exam.                           - Two diminutive polyps in the sigmoid colon and in                            the descending colon, removed with a cold snare.  Resected and retrieved.                           - Two 1 to 2 mm polyps in the sigmoid colon and in                            the cecum, removed with a cold biopsy forceps.                            Resected and retrieved.                           - Diverticulosis in the entire examined colon.                           - Internal hemorrhoids.                           - The examination was otherwise normal on direct                            and retroflexion views.                           -Hx > 10 adenomas and FHx CRCA also Recommendation:           - Patient has a contact number available for                            emergencies. The signs and symptoms of potential                            delayed complications were discussed with the                            patient. Return to normal activities tomorrow.                            Written discharge instructions were provided to the                            patient.                           - Resume previous diet.                           - Continue present medications.                           - Repeat colonoscopy is recommended for                            surveillance. The colonoscopy date will be  determined after pathology results from today's                            exam become available for review.                           - Refer for genetics  evaluation due to numerous                            colon adenomas and family hx colon cancer Gatha Mayer, MD 12/16/2019 9:58:37 AM This report has been signed electronically.

## 2019-12-16 NOTE — Progress Notes (Signed)
Report to PACU, RN, vss, BBS= Clear.  

## 2019-12-16 NOTE — Progress Notes (Signed)
Called to room to assist during endoscopic procedure.  Patient ID and intended procedure confirmed with present staff. Received instructions for my participation in the procedure from the performing physician.  

## 2019-12-16 NOTE — Patient Instructions (Addendum)
I found and removed 4 tiny polyps today.  I will let you know pathology results and when to have another routine colonoscopy by mail and/or My Chart.  Also saw hemorrhoids and diverticulosis like last time also.  We will make a referral to genetics.  If you want PSA checked go to primary care.  I appreciate the opportunity to care for you. Gatha Mayer, MD, FACG  YOU HAD AN ENDOSCOPIC PROCEDURE TODAY AT [facility]:   Refer to the procedure report that was given to you for any specific questions about what was found during the examination.  If the procedure report does not answer your questions, please call your gastroenterologist to clarify.  If you requested that your care partner not be given the details of your procedure findings, then the procedure report has been included in a sealed envelope for you to review at your convenience later.  YOU SHOULD EXPECT: Some feelings of bloating in the abdomen. Passage of more gas than usual.  Walking can help get rid of the air that was put into your GI tract during the procedure and reduce the bloating. If you had a lower endoscopy (such as a colonoscopy or flexible sigmoidoscopy) you may notice spotting of blood in your stool or on the toilet paper. If you underwent a bowel prep for your procedure, you may not have a normal bowel movement for a few days.  Please Note:  You might notice some irritation and congestion in your nose or some drainage.  This is from the oxygen used during your procedure.  There is no need for concern and it should clear up in a day or so.  SYMPTOMS TO REPORT IMMEDIATELY:   Following lower endoscopy (colonoscopy or flexible sigmoidoscopy):  Excessive amounts of blood in the stool  Significant tenderness or worsening of abdominal pains  Swelling of the abdomen that is new, acute  Fever of 100F or higher   For urgent or emergent issues, a gastroenterologist can be reached at any hour by calling  [phone]. Do not use MyChart messaging for urgent concerns.    DIET:  We do recommend a small meal at first, but then you may proceed to your regular diet.  Drink plenty of fluids but you should avoid alcoholic beverages for 24 hours.  ACTIVITY:  You should plan to take it easy for the rest of today and you should NOT DRIVE or use heavy machinery until tomorrow (because of the sedation medicines used during the test).    FOLLOW UP: Our staff will call the number listed on your records 48-72 hours following your procedure to check on you and address any questions or concerns that you may have regarding the information given to you following your procedure. If we do not reach you, we will leave a message.  We will attempt to reach you two times.  During this call, we will ask if you have developed any symptoms of COVID 19. If you develop any symptoms (ie: fever, flu-like symptoms, shortness of breath, cough etc.) before then, please call [phone].  If you test positive for Covid 19 in the 2 weeks post procedure, please call and report this information to [place].    If any biopsies were taken you will be contacted by phone or by letter within the next 1-3 weeks.  Please call us at [phone] if you have not heard about the biopsies in 3 weeks.    SIGNATURES/CONFIDENTIALITY: You and/or your  care partner have signed paperwork which will be entered into your electronic medical record.  These signatures attest to the fact that that the information above on your After Visit Summary has been reviewed and is understood.  Full responsibility of the confidentiality of this discharge information lies with you and/or your care-partner.

## 2019-12-17 ENCOUNTER — Other Ambulatory Visit: Payer: Self-pay

## 2019-12-17 DIAGNOSIS — D126 Benign neoplasm of colon, unspecified: Secondary | ICD-10-CM

## 2019-12-17 DIAGNOSIS — Z800 Family history of malignant neoplasm of digestive organs: Secondary | ICD-10-CM

## 2019-12-18 ENCOUNTER — Telehealth: Payer: Self-pay

## 2019-12-18 ENCOUNTER — Telehealth: Payer: Self-pay | Admitting: *Deleted

## 2019-12-18 NOTE — Telephone Encounter (Signed)
Second post procedure follow up call, no answer 

## 2019-12-18 NOTE — Telephone Encounter (Signed)
Attempted f/u phone call. No answer. Left message. °

## 2019-12-24 ENCOUNTER — Encounter: Payer: Self-pay | Admitting: Internal Medicine

## 2019-12-24 DIAGNOSIS — Z86010 Personal history of colonic polyps: Secondary | ICD-10-CM

## 2019-12-25 ENCOUNTER — Telehealth: Payer: Self-pay | Admitting: Genetic Counselor

## 2019-12-25 NOTE — Telephone Encounter (Signed)
Received a genetic counseling referral from Dr. Carlean Purl for fhx of colon cancer and colon adenomas. Mr. Ruben Andrews has been cld and scheduled to see Santiago Glad on 10/6 at 2pm. Letter mailed.

## 2020-01-20 ENCOUNTER — Inpatient Hospital Stay: Payer: Medicare Other | Attending: Genetic Counselor | Admitting: Genetic Counselor

## 2020-01-20 ENCOUNTER — Inpatient Hospital Stay: Payer: Medicare Other

## 2020-06-20 ENCOUNTER — Ambulatory Visit: Payer: Medicare Other | Admitting: Nurse Practitioner

## 2020-09-16 ENCOUNTER — Ambulatory Visit: Payer: Medicare Other | Admitting: Nurse Practitioner

## 2020-12-26 ENCOUNTER — Ambulatory Visit: Payer: Medicare Other | Admitting: Nurse Practitioner

## 2021-06-14 ENCOUNTER — Encounter: Payer: Self-pay | Admitting: Internal Medicine

## 2021-10-19 ENCOUNTER — Ambulatory Visit (HOSPITAL_COMMUNITY)
Admission: EM | Admit: 2021-10-19 | Discharge: 2021-10-19 | Disposition: A | Discharge status: Rehabilitation Facility | Payer: Medicare Other | Attending: Behavioral Health | Admitting: Behavioral Health

## 2021-10-19 ENCOUNTER — Encounter (HOSPITAL_COMMUNITY): Payer: Self-pay | Admitting: Emergency Medicine

## 2021-10-19 DIAGNOSIS — F1994 Other psychoactive substance use, unspecified with psychoactive substance-induced mood disorder: Secondary | ICD-10-CM | POA: Diagnosis not present

## 2021-10-19 DIAGNOSIS — F1990 Other psychoactive substance use, unspecified, uncomplicated: Secondary | ICD-10-CM

## 2021-10-19 DIAGNOSIS — F1410 Cocaine abuse, uncomplicated: Secondary | ICD-10-CM

## 2021-10-19 DIAGNOSIS — F1290 Cannabis use, unspecified, uncomplicated: Secondary | ICD-10-CM | POA: Diagnosis not present

## 2021-10-19 DIAGNOSIS — Z602 Problems related to living alone: Secondary | ICD-10-CM | POA: Insufficient documentation

## 2021-10-19 DIAGNOSIS — F1414 Cocaine abuse with cocaine-induced mood disorder: Secondary | ICD-10-CM | POA: Diagnosis not present

## 2021-10-19 DIAGNOSIS — Z20822 Contact with and (suspected) exposure to covid-19: Secondary | ICD-10-CM | POA: Insufficient documentation

## 2021-10-19 LAB — RESP PANEL BY RT-PCR (FLU A&B, COVID) ARPGX2
Influenza A by PCR: NEGATIVE
Influenza B by PCR: NEGATIVE
SARS Coronavirus 2 by RT PCR: NEGATIVE

## 2021-10-19 NOTE — BH Assessment (Signed)
LCSW Progress Note  Per Darrol Angel, NP, this pt does not require psychiatric hospitalization at this time.  Pt is psychiatrically cleared. LCSW assisted pt in linking him to [facility] to complete the intake process.  Pt was accepted and a guaranteed bed is waiting on him.  EDP Darrol Angel, NP, has been notified.  Omelia Blackwater, MSW, [place] [phone] or [phone]

## 2021-10-19 NOTE — Discharge Instructions (Addendum)
Based on what you shared, you will follow-up with residential treatment for substance use at the following:  [facility] [address]  [place], [place], [address] [phone] phone [phone] phone [phone] fax  A bed is on hold for you at this time, therefore, it is important for you to leave immediately after discharge from [facility] to [facility] so that you are able to be admitted and not lose your place.  [facility] will take you from [address] [place], [place], [address] to [address] [place], [place], [address] on 19 October 2021.  You are permitted to take two bags of belongings and anything that is electronic (including your phones) will be secured safely at the treatment facility for the duration of your stay.

## 2021-10-19 NOTE — ED Notes (Signed)
Patient A&Ox4. Denies intent to harm self/others when asked. Denies A/VH. Patient denies any physical complaints when asked. No acute distress noted. Support and encouragement provided. Routine safety checks conducted according to facility protocol. Encouraged patient to notify staff if thoughts of harm toward self or others arise. Patient verbalize understanding and agreement. Will continue to monitor for safety.    

## 2021-10-19 NOTE — ED Notes (Signed)
Patient resting quietly in bed with eyes open, Respirations equal and unlabored, skin warm and dry, NAD. No change in assessment or acuity. Routine safety checks conducted according to facility protocol. Will continue to monitor for safety.

## 2021-10-19 NOTE — BH Assessment (Signed)
Ruben Andrews is a 66 year old male presenting to [facility] voluntarily with chief complaint of crack cocaine use. 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 Pt reports that his friend told him about [facility] and he is ready for treatment. Pt denies SI and AVH however reports HI towards gang members, no plan or intent.

## 2021-10-19 NOTE — BH Assessment (Addendum)
Comprehensive Clinical Assessment (CCA) Note  10/19/2021 Ruben Andrews [id number]  DISPOSITION: Per Darrol Angel NP, pt is recommended for residential substance use treatment. SW-TOC is assisting with resources for follow-up.    The patient demonstrates the following risk factors for suicide: Chronic risk factors for suicide include: psychiatric disorder of MDD and substance use disorder. Acute risk factors for suicide include: family or marital conflict, unemployment, and loss (financial, interpersonal, professional). Protective factors for this patient include: positive social support, responsibility to others (children, family), and hope for the future. Considering these factors, the overall suicide risk at this point appears to be low. Patient is appropriate for outpatient follow up.   Per Triage assessment: "Pt uses (crack) looking for detox/rehab treatment. Denies SI, HI, AVH."  Upon further assessment: Pt is a 66 yo male who presented today after a confrontation with his fiancee about his drug use yesterday. Pt stated that his fiancee stated that if he did not get help the relationship may not last. Pt stated he made some calls to rehab facilities yesterday but was referred to [facility] to start the process. Pt stated that he has been using "some kind of drug" for 20 years, mostly crack." Pt also reported use of cannabis and alcohol. Pt reported daily crack cocaine use, use of THC about 2 x week and use of alcohol about 2 x a month. Pt denied SI, any past suicide attempts, HI, NSSH, AVH and paranoia. Pt denied any withdrawal sx without alcohol including no hx of seizures and no DT sx. Pt reported being diagnosed with GAD " a few years ago" and being prescribed medications. Pt stated that he stopped taking his prescribed medications about a year ago and currently does not have any OP psychiatric providers. Pt denied any hx of past suicide attempts or psychiatric admissions.   Pt reported he lives  alone in an apartment. He reported that he can get verbally aggressive at times but, he stated he believes it is due to drug use and the negative changes it has caused in his life. Pt reported he feels sad and cries at times. Pt was tearful at times during the assessment. 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 Pt stated he is no longer employed due to his drug use. Pt denied any current legal issue and stated he could get access to a gun if he wanted to but stated he has not to date.   Pt was calm, cooperative, alert and appeared oriented. Pt did not appear to be responding to internal stimuli, experiencing delusional thinking or intoxicated. Pt's speech and movement appeared within normal limits and appearance was unremarkable. Pt's mood seemed anxious and depressed, and pt had a flat affect which was congruent. Pt's speech and movement seemed nervous and jittery. Pt's judgment and insight seemed fair.    Chief Complaint:  Chief Complaint  Patient presents with   Addiction Problem   Visit Diagnosis:  MDD, Single Episode  Stimulant Use disorder, Cocaine Cannabis Use disorder   CCA Screening, Triage and Referral (STR)  Patient Reported Information How did you hear about [place]? Self  What Is the Reason for Your Visit/Call Today? Detox/rehab treatment  How Long Has This Been Causing You Problems? 1-6 months  What Do You Feel Would Help You the Most Today? Alcohol or Drug Use Treatment   Have You Recently Had Any Thoughts About Hurting  Yourself? No  Are You Planning to Commit Suicide/Harm Yourself At This time? No   Have you Recently Had Thoughts About [place]? Yes  Are You Planning to Harm Someone at This Time? No  Explanation: No data recorded  Have You Used Any Alcohol or Drugs in the Past 24 Hours? No  How Long Ago Did You Use Drugs or Alcohol? No data recorded What Did You  Use and How Much? No data recorded  Do You Currently Have a Therapist/Psychiatrist? No  Name of Therapist/Psychiatrist: No data recorded  Have You Been Recently Discharged From Any Office Practice or Programs? No data recorded Explanation of Discharge From Practice/Program: No data recorded    CCA Screening Triage Referral Assessment Type of Contact: Face-to-Face  Telemedicine Service Delivery:   Is this Initial or Reassessment? No data recorded Date Telepsych consult ordered in [facility]:  No data recorded Time Telepsych consult ordered in [facility]:  No data recorded Location of Assessment: [facility] Assessment Services  Provider Location: [facility] Assessment Services   Collateral Involvement: none   Does Patient Have a [place]? No data recorded Name and Contact of Legal Guardian: No data recorded If Minor and Not Living with Parent(s), Who has Custody? No data recorded Is CPS involved or ever been involved? -- (uta)  Is APS involved or ever been involved? -- Pincus Badder)   Patient Determined To Be At Risk for Harm To Self or Others Based on Review of Patient Reported Information or Presenting Complaint? No data recorded Method: No data recorded Availability of Means: No data recorded Intent: No data recorded Notification Required: No data recorded Additional Information for Danger to Others Potential: No data recorded Additional Comments for Danger to Others Potential: No data recorded Are There Guns or Other Weapons in Your Home? No data recorded Types of Guns/Weapons: No data recorded Are These Weapons Safely Secured?                            No data recorded Who Could Verify You Are Able To Have These Secured: No data recorded Do You Have any Outstanding Charges, Pending Court Dates, Parole/Probation? No data recorded Contacted To Inform of Risk of Harm To Self or Others: No data recorded   Does Patient Present under Involuntary Commitment? No  IVC Papers  Initial File Date: No data recorded  [place] of Residence: [place]   Patient Currently Receiving the Following Services: No data recorded  Determination of Need: Urgent (48 hours)   Options For Referral: Chemical Dependency Intensive Outpatient Therapy (CDIOP)     CCA Biopsychosocial Patient Reported Schizophrenia/Schizoaffective Diagnosis in Past: No   Strengths: uta   Mental Health Symptoms Depression:   Change in energy/activity; Difficulty Concentrating; Fatigue; Irritability; Tearfulness   Duration of Depressive symptoms:  Duration of Depressive Symptoms: Greater than two weeks   Mania:   Irritability   Anxiety:    Irritability   Psychosis:   None   Duration of Psychotic symptoms:    Trauma:   None   Obsessions:   None   Compulsions:   None   Inattention:   None   Hyperactivity/Impulsivity:   None   Oppositional/Defiant Behaviors:   None   Emotional Irregularity:   Mood lability   Other Mood/Personality Symptoms:   uta    Mental Status Exam Appearance and self-care  Stature:   Tall   Weight:   Thin   Clothing:  Casual   Grooming:   Normal   Cosmetic use:   None   Posture/gait:   Normal   Motor activity:   Not Remarkable   Sensorium  Attention:   Normal   Concentration:   Normal   Orientation:   X5   Recall/memory:   Normal   Affect and Mood  Affect:   Depressed; Flat   Mood:   Depressed   Relating  Eye contact:   Normal   Facial expression:   Depressed   Attitude toward examiner:   Cooperative; Guarded   Thought and Language  Speech flow:  Clear and Coherent; Paucity   Thought content:   Appropriate to Mood and Circumstances   Preoccupation:   None   Hallucinations:   None   Organization:  No data recorded  [employer] of Knowledge:   Average   Intelligence:   Average   Abstraction:   Functional   Judgement:   Fair   Reality Testing:   Adequate    Insight:   Fair   Decision Making:   Impulsive   Social Functioning  Social Maturity:   Impulsive   Social Judgement:   Heedless   Stress  Stressors:   Family conflict; Financial; Relationship; Work   Coping Ability:   Deficient supports (No OP providers)   Skill Deficits:   -- Pincus Badder)   Supports:   Friends/Service system; Support needed     Religion: Religion/Spirituality Are You 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)  Leisure/Recreation: Leisure / Recreation Do You Have Hobbies?: Yes (Pt stated he does not participate anymore but wants to return.) Leisure and Hobbies: singing in a Copy quartet at church  Exercise/Diet: Exercise/Diet Do You Exercise?:  (uta) Have You Gained or Lost A Significant Amount of Weight in the Past Six Months?:  (uta) Do You Follow a Special Diet?: No Do You Have Any Trouble Sleeping?: No   CCA Employment/Education Employment/Work Situation: Employment / Work Situation Employment Situation: Unemployed (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.) Patient's Job has Been Impacted by Current Illness: Yes  Education: Education Is Patient Currently Attending School?: No Last Grade Completed: 12 Did You Attend College?: No Did You Have An Individualized Education Program (IIEP): No   CCA Family/Childhood History Family and Relationship History: Family history Marital status: Long term relationship What types of issues is patient dealing with in the relationship?: pt's drug use  Childhood History:  Childhood History By whom was/is the patient raised?:  (uta) Did patient suffer any verbal/emotional/physical/sexual abuse as a child?: No Has patient ever been sexually abused/assaulted/raped as an adolescent or adult?: No  Child/Adolescent Assessment:     CCA Substance Use Alcohol/Drug Use: Alcohol / Drug Use Pain Medications: see MAR Prescriptions: see MAR Over the Counter: see  MAR History of alcohol / drug use?: Yes Longest period of sobriety (when/how long): Pt stated he was sober for 7 years starting in 2005. Substance #1 Name of Substance 1: crack cocaine 1 - Age of First Use: 20 years ago 1 - Amount (size/oz): varies 1 - Frequency: an 8 ball every 2 days/daily use 1 - Duration: ongoing 1 - Last Use / Amount: yesterday 1 - Method of Aquiring: unknown 1- Route of Use: smokes Substance #2 Name of Substance 2: THC 2 - Age of First Use: unknown 2 - Amount (size/oz): varies 2 - Frequency: 2 x week 2 - Duration: ongoing 2 -  Last Use / Amount: yesterday 2 - Method of Aquiring: unknown 2 - Route of Substance Use: smokes Substance #3 Name of Substance 3: Alcohol 3 - Age of First Use: unknown 3 - Amount (size/oz): varies 3 - Frequency: 2 x month 3 - Duration: ongoing 3 - Last Use / Amount: 2 days ago 3 - Method of Aquiring: unknown 3 - Route of Substance Use: oral/drink                   ASAM's:  Six Dimensions of Multidimensional Assessment  Dimension 1:  Acute Intoxication and/or Withdrawal Potential:      Dimension 2:  Biomedical Conditions and Complications:      Dimension 3:  Emotional, Behavioral, or Cognitive Conditions and Complications:     Dimension 4:  Readiness to Change:     Dimension 5:  Relapse, Continued use, or Continued Problem Potential:     Dimension 6:  Recovery/Living Environment:     ASAM Severity Score:    ASAM Recommended Level of Treatment:     Substance use Disorder (SUD)    Recommendations for Services/Supports/Treatments:    Discharge Disposition:    DSM5 Diagnoses: Patient Active Problem List   Diagnosis Date Noted   Prostate cancer ([place]) 07/27/2018   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 Mare Ferrari, [place], [facility], [facility] Triage Specialist [facility]

## 2021-10-19 NOTE — ED Notes (Signed)
Pt A&O x 4, no distress  noted, pending [facility] at 10.30 pm via [facility] [facility].

## 2021-10-19 NOTE — ED Provider Notes (Addendum)
Behavioral Health Urgent Care Medical Screening Exam  Patient Name: Ruben Andrews MRN: [id number] Date of Evaluation: 10/19/21 Diagnosis:  Final diagnoses:  Cocaine use disorder ([place])  Polysubstance use disorder  Substance induced mood disorder ([place])    History of Present illness: Ruben Andrews is a 66 y.o. male patient who presents to the [facility] voluntary as a walk in with a chief complaint of seeking substance abuse treatment for crack cocaine use.   Patient seen and evaluated face to face by this provider, and chart reviewed. Patient has a past psychiatric history of GAD. On evaluation, patient is alert and oriented x 4. His thought process is logical, and goal oriented. His speech is clear and coherent. His mood is dysphoric and affect is congruent.   Patient states, "I am tired of fulling with drugs, crack cocaine. I have loss love ones because I won't sit my behind down." He reports using crack cocaine on and off for the past 20 years.  He reports using cocaine on average an 8th ball every other day. He reports that he last used cocaine 2 days ago. He reports a sobriety of 7 years in 2005. He reports occasional marijuana use, once or twice per week. He reports occasional alcohol use when partaking with friends. He reports drinking alcohol a couple times per week, on average 1/2 quarter of beer or 1/2 bottle of liquor. He states that his alcohol use is nothing concerning because he rarely drinks. He reports last consuming alcohol two days ago. He denies alcohol withdrawal symptoms currently. No evidence or signs of alcohol withdrawal symptoms at this time. He denies a history of alcohol withdrawal symptoms, seizures or delirium tremens. He denies past substance abuse treatments.   He denies SI/HI/AVH. No objective evidence of psychosis observed. No history of suicide attempts. He reports intense depressive symptoms for the past several months: feelings of sadness, worthlessness,  hopelessness, and irritability due to thoughts of how his life is going. He reports fair sleep. He reports a fair appetite. He lives alone in an apartment. He states that he has a fiancee that is supportive and wants him to get treatment. 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 He denies legal issues. He denies access to guns in his home.   He states that he called a substance abuse facility in [place]  and was told that he would need to detox before he could presents for treatment. I discussed with the patient that there is no detox treatment for cocaine use. Patient stated that he rarely drinks alcohol and his last drink was two days ago. Patient is not exhibiting alcohol withdrawal symptoms at this time. No need for alcohol detox at this time. I discussed with the patient residential substance abuse and outpatient substance abuse treatment options. Consulted with Nira Conn, CSW., for substance abuse referrals for residential treatment.    Patient accepted to [facility] [address] [place], [place], [address] on 19 October 2021. Patient will be transported via [employer].   Psychiatric Specialty Exam  Presentation  General Appearance:Appropriate for Environment  Eye Contact:Fair  Speech:Clear and Coherent  Speech Volume:Normal  Mood and Affect  Mood:Dysphoric  Affect:Congruent   Thought Process  Thought Processes:Coherent; Goal Directed; Linear  Descriptions of Associations:Intact  Orientation:Full (Time, Place and Person)  Thought Content:Logical    Hallucinations:None  Ideas of Reference:None  Suicidal Thoughts:No  Homicidal Thoughts:No   Sensorium  Memory:Immediate Fair; Recent Fair; Remote Fair  Judgment:Fair  Insight:Fair   Executive Functions  Concentration:Fair  Attention Span:Fair  [place]   Psychomotor Activity  Psychomotor Activity:Normal   Assets   Assets:Communication Skills; Desire for Improvement; Housing; Intimacy; Leisure Time; Physical Health; Resilience; Social Support   Sleep  Sleep:Fair  Number of hours: 5   No data recorded  Physical Exam: Physical Exam HENT:     Head: Normocephalic.     Nose: Nose normal.  Eyes:     Conjunctiva/sclera: Conjunctivae normal.  Cardiovascular:     Rate and Rhythm: Normal rate.  Pulmonary:     Effort: Pulmonary effort is normal.  Musculoskeletal:        General: Normal range of motion.     Cervical back: Normal range of motion.  Neurological:     Mental Status: He is alert and oriented to person, place, and time.    Review of Systems  Constitutional: Negative.   HENT: Negative.    Eyes: Negative.   Respiratory: Negative.    Cardiovascular: Negative.   Gastrointestinal: Negative.   Genitourinary: Negative.   Musculoskeletal: Negative.   Skin: Negative.   Neurological: Negative.   Endo/Heme/Allergies: Negative.    Blood pressure (!) 158/96, pulse 78, temperature 98 F (36.7 C), temperature source Oral, resp. rate 18, SpO2 100 %. There is no height or weight on file to calculate BMI.  Musculoskeletal: Strength & Muscle Tone: within normal limits Gait & Station: normal Patient leans: N/A   [place] [facility] Discharge Disposition for Follow up and Recommendations: Based on my evaluation the patient does not appear to have an emergency medical condition and can be discharged with resources and follow up care in outpatient services for substance abuse treatment and residential substance use treatment.    Marissa Calamity, NP 10/19/2021, 12:34 PM

## 2021-10-19 NOTE — ED Notes (Signed)
Called [facility] to confirm 10.30 pm pick up to [facility].  States to transport pt at approximately 11 pm. To [facility].

## 2021-10-19 NOTE — ED Notes (Signed)
Pt sleeping at present, no distress noted.  Monitoring for safety. Pending dispo.

## 2021-10-19 NOTE — ED Notes (Signed)
Ruben Andrews has arrived to [facility] observation. Bed 4

## 2021-11-03 ENCOUNTER — Encounter: Payer: Medicare Other | Admitting: Internal Medicine

## 2021-11-30 ENCOUNTER — Encounter: Payer: Medicare Other | Admitting: Internal Medicine
